# Patient Record
Sex: Female | Born: 1995 | Race: White | Hispanic: No | Marital: Single | Smoking: Never smoker
Health system: Southern US, Community
[De-identification: ages and names within clinical notes are randomized; demographics above are authoritative.]

## PROBLEM LIST (undated history)

## (undated) DIAGNOSIS — R569 Unspecified convulsions: Secondary | ICD-10-CM

## (undated) DIAGNOSIS — G43909 Migraine, unspecified, not intractable, without status migrainosus: Secondary | ICD-10-CM

## (undated) DIAGNOSIS — F913 Oppositional defiant disorder: Secondary | ICD-10-CM

## (undated) HISTORY — PX: NO PAST SURGERIES: SHX2092

---
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
## 2016-03-19 ENCOUNTER — Emergency Department: Payer: BLUE CROSS/BLUE SHIELD

## 2016-03-19 ENCOUNTER — Emergency Department
Admission: EM | Admit: 2016-03-19 | Discharge: 2016-03-20 | Disposition: A | Discharge status: Home / Self Care | Payer: BLUE CROSS/BLUE SHIELD | Attending: Emergency Medicine | Admitting: Emergency Medicine

## 2016-03-19 ENCOUNTER — Encounter: Payer: Self-pay | Admitting: Emergency Medicine

## 2016-03-19 DIAGNOSIS — Y999 Unspecified external cause status: Secondary | ICD-10-CM | POA: Insufficient documentation

## 2016-03-19 DIAGNOSIS — S99911A Unspecified injury of right ankle, initial encounter: Secondary | ICD-10-CM | POA: Diagnosis present

## 2016-03-19 DIAGNOSIS — W108XXA Fall (on) (from) other stairs and steps, initial encounter: Secondary | ICD-10-CM | POA: Diagnosis not present

## 2016-03-19 DIAGNOSIS — Y929 Unspecified place or not applicable: Secondary | ICD-10-CM | POA: Diagnosis not present

## 2016-03-19 DIAGNOSIS — Y9301 Activity, walking, marching and hiking: Secondary | ICD-10-CM | POA: Insufficient documentation

## 2016-03-19 DIAGNOSIS — Z791 Long term (current) use of non-steroidal anti-inflammatories (NSAID): Secondary | ICD-10-CM | POA: Insufficient documentation

## 2016-03-19 DIAGNOSIS — S82891A Other fracture of right lower leg, initial encounter for closed fracture: Secondary | ICD-10-CM | POA: Insufficient documentation

## 2016-03-19 DIAGNOSIS — Z79899 Other long term (current) drug therapy: Secondary | ICD-10-CM | POA: Insufficient documentation

## 2016-03-19 LAB — COMPREHENSIVE METABOLIC PANEL
ALK PHOS: 119 U/L (ref 38–126)
ALT: 12 U/L — AB (ref 14–54)
AST: 17 U/L (ref 15–41)
Albumin: 4.8 g/dL (ref 3.5–5.0)
Anion gap: 7 (ref 5–15)
BILIRUBIN TOTAL: 0.5 mg/dL (ref 0.3–1.2)
BUN: 9 mg/dL (ref 6–20)
CALCIUM: 9.8 mg/dL (ref 8.9–10.3)
CO2: 30 mmol/L (ref 22–32)
CREATININE: 0.83 mg/dL (ref 0.44–1.00)
Chloride: 104 mmol/L (ref 101–111)
Glucose, Bld: 124 mg/dL — ABNORMAL HIGH (ref 65–99)
Potassium: 3.7 mmol/L (ref 3.5–5.1)
Sodium: 141 mmol/L (ref 135–145)
TOTAL PROTEIN: 8.4 g/dL — AB (ref 6.5–8.1)

## 2016-03-19 LAB — URINALYSIS COMPLETE WITH MICROSCOPIC (ARMC ONLY)
BILIRUBIN URINE: NEGATIVE
Bacteria, UA: NONE SEEN
GLUCOSE, UA: NEGATIVE mg/dL
HGB URINE DIPSTICK: NEGATIVE
Ketones, ur: NEGATIVE mg/dL
LEUKOCYTES UA: NEGATIVE
NITRITE: NEGATIVE
Protein, ur: NEGATIVE mg/dL
SPECIFIC GRAVITY, URINE: 1.02 (ref 1.005–1.030)
pH: 7 (ref 5.0–8.0)

## 2016-03-19 LAB — URINE DRUG SCREEN, QUALITATIVE (ARMC ONLY)
AMPHETAMINES, UR SCREEN: NOT DETECTED
Barbiturates, Ur Screen: NOT DETECTED
Benzodiazepine, Ur Scrn: NOT DETECTED
CANNABINOID 50 NG, UR ~~LOC~~: NOT DETECTED
Cocaine Metabolite,Ur ~~LOC~~: NOT DETECTED
MDMA (ECSTASY) UR SCREEN: NOT DETECTED
METHADONE SCREEN, URINE: NOT DETECTED
Opiate, Ur Screen: NOT DETECTED
Phencyclidine (PCP) Ur S: NOT DETECTED
TRICYCLIC, UR SCREEN: NOT DETECTED

## 2016-03-19 LAB — CBC
HCT: 42 % (ref 35.0–47.0)
HEMOGLOBIN: 14.1 g/dL (ref 12.0–16.0)
MCH: 29.5 pg (ref 26.0–34.0)
MCHC: 33.5 g/dL (ref 32.0–36.0)
MCV: 88 fL (ref 80.0–100.0)
PLATELETS: 355 10*3/uL (ref 150–440)
RBC: 4.78 MIL/uL (ref 3.80–5.20)
RDW: 13.5 % (ref 11.5–14.5)
WBC: 12.1 10*3/uL — ABNORMAL HIGH (ref 3.6–11.0)

## 2016-03-19 LAB — POCT PREGNANCY, URINE: Preg Test, Ur: NEGATIVE

## 2016-03-19 LAB — PREGNANCY, URINE: PREG TEST UR: NEGATIVE

## 2016-03-19 NOTE — ED Provider Notes (Signed)
[facility] Emergency Department Provider Note   ____________________________________________  Time seen: Approximately 11:38 PM  I have reviewed the triage vital signs and the nursing notes.   HISTORY  Chief Complaint Dizziness    HPI Christina Case is a 20 y.o. female patient reports she was walking and got lightheaded and fell down. She fell down steps and hurt her right ankle. Her right ankle hurting at the tip of the fibula. Patient is never done this before. Patient said she used to have seizures when she was very small and took medicines but has not taken any medicines for at least 2 years. She did not have any seizures in that time. This did not really feel like a seizure. She did not lose consciousness. She did not hit her head and had no headache or neck pain. She later developed a headache that gradually increased to be fairly bad. She reports she is not lightheaded when she stands here.  Past Medical History  Diagnosis Date  . ODD (oppositional defiant disorder)     Patient Active Problem List   Diagnosis Date Noted  . 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 ODD (oppositional defiant disorder) 03/02/2013  . Simple phobia 03/02/2013    History reviewed. No pertinent past surgical history.  Current Outpatient Rx  Name  Route  Sig  Dispense  Refill  . buPROPion (WELLBUTRIN XL) 300 MG 24 hr tablet   Oral   Take 1 tablet (300 mg total) by mouth daily.   30 tablet   0   . diclofenac (VOLTAREN) 75 MG EC tablet   Oral   Take 75 mg by mouth 2 (two) times daily.         . diclofenac (VOLTAREN) 75 MG EC tablet   Oral   Take 1 tablet (75 mg total) by mouth 2 (two) times daily. Patient may resume home supply.         . loratadine (CLARITIN) 10 MG tablet   Oral   Take 10 mg by mouth daily.         Marland Kitchen. loratadine (CLARITIN) 10 MG tablet   Oral   Take 1 tablet (10 mg total) by mouth daily. Patient may resume home supply.            Allergies Hydrocodone and Oxycodone  History reviewed. No pertinent family history.  Social History Social History  Substance Use Topics  . Smoking status: None  . Smokeless tobacco: None  . Alcohol Use: None    Review of Systems Constitutional: No fever/chills Eyes: No visual changes. ENT: No sore throat. Cardiovascular: Denies chest pain. Respiratory: Denies shortness of breath. Gastrointestinal: No abdominal pain.  No nausea, no vomiting.  No diarrhea.  No constipation. Genitourinary: Negative for dysuria. Musculoskeletal: Negative for back pain. Skin: Negative for rash. Neurological: Negative for focal weakness or numbness.  10-point ROS otherwise negative.  ____________________________________________   PHYSICAL EXAM:  VITAL SIGNS: ED Triage Vitals  Enc Vitals Group     BP 03/19/16 2122 131/82 mmHg     Pulse Rate 03/19/16 2122 98     Resp 03/19/16 2122 18     Temp 03/19/16 2122 98.4 F (36.9 C)     Temp Source 03/19/16 2122 Oral     SpO2 03/19/16 2122 100 %     Weight 03/19/16 2122 130 lb (58.968 kg)     Height 03/19/16 2122 5\' 6"  (1.676 m)     Head Cir --  Peak Flow --      Pain Score 03/19/16 2122 4     Pain Loc --      Pain Edu? --      Excl. in GC? --     Constitutional: Alert and oriented. Well appearing and in no acute distress. Eyes: Conjunctivae are normal. PERRL. EOMI. Head: Atraumatic. Nose: No congestion/rhinnorhea. Mouth/Throat: Mucous membranes are moist.  Oropharynx non-erythematous. Neck: No stridor.  No cervical spine tenderness to palpation. Cardiovascular: Normal rate, regular rhythm. Grossly normal heart sounds.  Good peripheral circulation. Respiratory: Normal respiratory effort.  No retractions. Lungs CTAB. Gastrointestinal: Soft and nontender. No distention. No abdominal bruits. No CVA tenderness. Musculoskeletal: No lower extremity tenderness nor edema Except for some tenderness at the distal tip of the right fibula..   No joint effusions. Neurologic:  Normal speech and language. No gross focal neurologic deficits are appreciated. No gait instability. Skin:  Skin is warm, dry and intact. No rash noted. Psychiatric: Mood and affect are normal. Speech and behavior are normal.  ____________________________________________   LABS (all labs ordered are listed, but only abnormal results are displayed)  Labs Reviewed  CBC - Abnormal; Notable for the following:    WBC 12.1 (*)    All other components within normal limits  COMPREHENSIVE METABOLIC PANEL - Abnormal; Notable for the following:    Glucose, Bld 124 (*)    Total Protein 8.4 (*)    ALT 12 (*)    All other components within normal limits  URINALYSIS COMPLETEWITH MICROSCOPIC ([facility] ONLY) - Abnormal; Notable for the following:    Color, Urine YELLOW (*)    APPearance CLEAR (*)    Squamous Epithelial / LPF 0-5 (*)    All other components within normal limits  PREGNANCY, URINE  URINE DRUG SCREEN, QUALITATIVE ([facility] ONLY)  POC URINE PREG, ED  POCT PREGNANCY, URINE   ____________________________________________  EKG  EKG read and interpreted by me shows normal sinus rhythm at a rate of 85 normal axis ____________________________________________  RADIOLOGY  ____________________________________________  IMPRESSION: Soft tissue swelling about the lateral malleolus with associated tiny age-indeterminate ossicle adjacent to the tip of the fibula favored to represent an age-indeterminate avulsive injury. Correlation for tenderness at this location is recommended. Otherwise, no displaced fracture.   Electronically Signed  By: Simonne Come M.D.  On: 03/19/2016 21:51   Chest x-ray read as no acute disease PROCEDURES    ____________________________________________   INITIAL IMPRESSION / ASSESSMENT AND PLAN / ED COURSE  Pertinent labs & imaging results that were available during my care of the patient were reviewed by me and  considered in my medical decision making (see chart for details).   ____________________________________________   FINAL CLINICAL IMPRESSION(S) / ED DIAGNOSES  Final diagnoses:  Avulsion fracture of ankle, right, closed, initial encounter   Additional diagnosis is near syncope   NEW MEDICATIONS STARTED DURING THIS VISIT:  New Prescriptions   No medications on file     Note:  This document was prepared using Dragon voice recognition software and may include unintentional dictation errors.    Arnaldo Natal, MD 03/19/16 [phone]

## 2016-03-19 NOTE — Discharge Instructions (Signed)
Cast or Splint Care Casts and splints support injured limbs and keep bones from moving while they heal. It is important to care for your cast or splint at home.  HOME CARE INSTRUCTIONS  Keep the cast or splint uncovered during the drying period. It can take 24 to 48 hours to dry if it is made of plaster. A fiberglass cast will dry in less than 1 hour.  Do not rest the cast on anything harder than a pillow for the first 24 hours.  Do not put weight on your injured limb or apply pressure to the cast until your health care provider gives you permission.  Keep the cast or splint dry. Wet casts or splints can lose their shape and may not support the limb as well. A wet cast that has lost its shape can also create harmful pressure on your skin when it dries. Also, wet skin can become infected.  Cover the cast or splint with a plastic bag when bathing or when out in the rain or snow. If the cast is on the trunk of the body, take sponge baths until the cast is removed.  If your cast does become wet, dry it with a towel or a blow dryer on the cool setting only.  Keep your cast or splint clean. Soiled casts may be wiped with a moistened cloth.  Do not place any hard or soft foreign objects under your cast or splint, such as cotton, toilet paper, lotion, or powder.  Do not try to scratch the skin under the cast with any object. The object could get stuck inside the cast. Also, scratching could lead to an infection. If itching is a problem, use a blow dryer on a cool setting to relieve discomfort.  Do not trim or cut your cast or remove padding from inside of it.  Exercise all joints next to the injury that are not immobilized by the cast or splint. For example, if you have a long leg cast, exercise the hip joint and toes. If you have an arm cast or splint, exercise the shoulder, elbow, thumb, and fingers.  Elevate your injured arm or leg on 1 or 2 pillows for the first 1 to 3 days to decrease  swelling and pain.It is best if you can comfortably elevate your cast so it is higher than your heart. SEEK MEDICAL CARE IF:   Your cast or splint cracks.  Your cast or splint is too tight or too loose.  You have unbearable itching inside the cast.  Your cast becomes wet or develops a soft spot or area.  You have a bad smell coming from inside your cast.  You get an object stuck under your cast.  Your skin around the cast becomes red or raw.  You have new pain or worsening pain after the cast has been applied. SEEK IMMEDIATE MEDICAL CARE IF:   You have fluid leaking through the cast.  You are unable to move your fingers or toes.  You have discolored (blue or white), cool, painful, or very swollen fingers or toes beyond the cast.  You have tingling or numbness around the injured area.  You have severe pain or pressure under the cast.  You have any difficulty with your breathing or have shortness of breath.  You have chest pain.   This information is not intended to replace advice given to you by your health care provider. Make sure you discuss any questions you have with your health care  provider.   Document Released: 11/01/2000 Document Revised: 08/25/2013 Document Reviewed: 05/13/2013 Elsevier Interactive Patient Education 2016 [employer]  Rhythm air splint for now he can take it off to shower but put it on afterwards. Tylenol or Motrin for pain. Call Dr. Samuel GermanyKrasinski's office he see orthopedic surgeon on call he can follow-up for your ankle avulsion fracture. [facility] with [facility] for the lightheadedness you had. Please return for any further problems

## 2016-03-19 NOTE — ED Notes (Signed)
Pt in with co dizziness for few days denies any recent illness and fell down 2 steps co right ankle pain due to fall.  Pt is ambulatory to triage.

## 2016-03-19 NOTE — ED Notes (Signed)
MD at bedside. 

## 2016-07-13 ENCOUNTER — Encounter: Payer: Self-pay | Admitting: Emergency Medicine

## 2016-07-13 ENCOUNTER — Emergency Department
Admission: EM | Admit: 2016-07-13 | Discharge: 2016-07-14 | Disposition: A | Discharge status: Other Acute Inpatient Hospital | Payer: BLUE CROSS/BLUE SHIELD | Attending: Emergency Medicine | Admitting: Emergency Medicine

## 2016-07-13 DIAGNOSIS — R45851 Suicidal ideations: Secondary | ICD-10-CM | POA: Insufficient documentation

## 2016-07-13 DIAGNOSIS — F913 Oppositional defiant disorder: Secondary | ICD-10-CM | POA: Insufficient documentation

## 2016-07-13 LAB — COMPREHENSIVE METABOLIC PANEL
ALT: 12 U/L — AB (ref 14–54)
AST: 20 U/L (ref 15–41)
Albumin: 4.8 g/dL (ref 3.5–5.0)
Alkaline Phosphatase: 122 U/L (ref 38–126)
Anion gap: 8 (ref 5–15)
BILIRUBIN TOTAL: 0.8 mg/dL (ref 0.3–1.2)
BUN: 8 mg/dL (ref 6–20)
CHLORIDE: 106 mmol/L (ref 101–111)
CO2: 27 mmol/L (ref 22–32)
CREATININE: 0.63 mg/dL (ref 0.44–1.00)
Calcium: 9.9 mg/dL (ref 8.9–10.3)
GFR calc Af Amer: >60 mL/min (ref >60)
Glucose, Bld: 103 mg/dL — ABNORMAL HIGH (ref 65–99)
Potassium: 4.3 mmol/L (ref 3.5–5.1)
Sodium: 141 mmol/L (ref 135–145)
Total Protein: 8.1 g/dL (ref 6.5–8.1)

## 2016-07-13 LAB — ETHANOL: Alcohol, Ethyl (B): <5 mg/dL (ref <5)

## 2016-07-13 LAB — CBC
HCT: 40.9 % (ref 35.0–47.0)
Hemoglobin: 13.7 g/dL (ref 12.0–16.0)
MCH: 29.9 pg (ref 26.0–34.0)
MCHC: 33.4 g/dL (ref 32.0–36.0)
MCV: 89.4 fL (ref 80.0–100.0)
PLATELETS: 335 10*3/uL (ref 150–440)
RBC: 4.57 MIL/uL (ref 3.80–5.20)
RDW: 12.8 % (ref 11.5–14.5)
WBC: 8.1 10*3/uL (ref 3.6–11.0)

## 2016-07-13 LAB — ACETAMINOPHEN LEVEL: Acetaminophen (Tylenol), Serum: <10 ug/mL — ABNORMAL LOW (ref 10–30)

## 2016-07-13 LAB — SALICYLATE LEVEL

## 2016-07-13 NOTE — ED Notes (Signed)
Pt given meal tray at this time. NAD noted. Pt remains with 1:1 sitter. Denies any needs at this time, will continue to monitor for further patient needs.

## 2016-07-13 NOTE — BH Assessment (Signed)
Assessment Note  Christina Case is an 20 y.o. female. Who has presented to the ER voluntarily seeking a psychiatric consult. Pt states that she was recently kicked out of her parents home and that she is struggling with adjusting to this sudden change. Pt reports that her parents think that she is disrespectful although she denies this. Pt states that she was in college but failed last semester and consequently has been forced to sit out until the spring.  Pt reports living with various friends and working part time at [employer]. Pt states that she enjoys her job and continues to enjoy spending time with her friends and participating in other leisure activities. Pt reports no change in sleep habits or appetite. Pt states that she has felt depressed for approximately one week and that she has began to have fleeting suicidal thoughts, onset x2 days. Pt states " I don' t want to deal with the situation so sometimes hurting myself seems easier." Pt did contract for safety with [facility] research [employer], stating that she felt she could be safe if discharged, although she has no where to go. Pt denies HI or substance use. Pt. denies the presence of any auditory or visual hallucinations at this time. Patient denies any other medical complaints. Pt states that she was most recently admitted to an inpatient unit in [facility] due to a suicide attempt. Pt shared that she attempted to overdose on caffeine pills. Pt is currently participating in individual therapy at [facility] located in [place]. Pt  admits to non- compliance with medication requests.Per pt medical record she carries a Dx of ODD.  Diagnosis: Adjustment Disorder     Past Medical History:  Past Medical History:  Diagnosis Date  . ODD (oppositional defiant disorder)     No past surgical history on file.  Family History: No family history on file.  Social History:  reports that she has never smoked. She has never used smokeless tobacco. She  reports that she drinks alcohol. She reports that she does not use drugs.  Additional Social History:  Alcohol / Drug Use Pain Medications: see MAR Prescriptions: see MAR Over the Counter: see MAR History of alcohol / drug use?: No history of alcohol / drug abuse Longest period of sobriety (when/how long): na  CIWA: CIWA-Ar BP: 125/70 Pulse Rate: 83 COWS:    Allergies:  Allergies  Allergen Reactions  . Hydrocodone Other (See Comments)    dizziness  . Oxycodone Other (See Comments)    dizziness  . Zofran [Ondansetron Hcl] Nausea And Vomiting    Home Medications:  (Not in a hospital admission)  OB/GYN Status:  Patient's last menstrual period was 07/04/2016.  General Assessment Data Location of Assessment: [facility] ED TTS Assessment: In system Is this a Tele or Face-to-Face Assessment?: Face-to-Face Is this an Initial Assessment or a Re-assessment for this encounter?: Initial Assessment Marital status: Single Is patient pregnant?: No Pregnancy Status: No Living Arrangements: Other (Comment), Non-relatives/Friends Can pt return to current living arrangement?: Yes Admission Status: Voluntary Is patient capable of signing voluntary admission?: Yes Referral Source: Self/Family/Friend Insurance type: BCBS  Medical Screening Exam [facility] Walk-in ONLY) Medical Exam completed: Yes  Crisis Care Plan Living Arrangements: Other (Comment), Non-relatives/Friends Legal Guardian: Other: Name of Psychiatrist: None Name of Therapist: None   Education Status Is patient currently in school?: No Current Grade: Some College Highest grade of school patient has completed: Some College Name of school: N/A Contact person: N/A  Risk to self with  the past 6 months Suicidal Ideation: Yes-Currently Present Has patient been a risk to self within the past 6 months prior to admission? : Yes Suicidal Intent: No Has patient had any suicidal intent within the past 6 months prior to admission? :  No Is patient at risk for suicide?: Yes Suicidal Plan?: No Has patient had any suicidal plan within the past 6 months prior to admission? : No Access to Means:  (N/A) What has been your use of drugs/alcohol within the last 12 months?: Alcohol- Distant past  Previous Attempts/Gestures: Yes How many times?: 1 Other Self Harm Risks: 0 Triggers for Past Attempts: Unknown Intentional Self Injurious Behavior: None Family Suicide History: No Recent stressful life event(s): Conflict (Comment), Loss (Comment), Financial Problems Persecutory voices/beliefs?: No Depression: Yes Depression Symptoms: Tearfulness, Feeling worthless/self pity Substance abuse history and/or treatment for substance abuse?: No Suicide prevention information given to non-admitted patients: Yes  Risk to Others within the past 6 months Homicidal Ideation: No Does patient have any lifetime risk of violence toward others beyond the six months prior to admission? : No Thoughts of Harm to Others: No Current Homicidal Intent: No Current Homicidal Plan: No Access to Homicidal Means: No Identified Victim: N/A History of harm to others?: No Assessment of Violence: None Noted Violent Behavior Description: None Noted Does patient have access to weapons?: No Criminal Charges Pending?: No Does patient have a court date: No Is patient on probation?: No  Psychosis Hallucinations: None noted Delusions: None noted  Mental Status Report Appearance/Hygiene: In scrubs Eye Contact: Good Motor Activity: Freedom of movement Speech: Soft Level of Consciousness: Alert Mood: Depressed Affect: Sullen, Sad Anxiety Level: None Thought Processes: Relevant Judgement: Partial Orientation: Place, Person, Time, Situation Obsessive Compulsive Thoughts/Behaviors: None  Cognitive Functioning Concentration: Good Memory: Remote Intact, Recent Intact IQ: Average Insight: Fair Impulse Control: Fair Appetite: Fair Weight Loss:  0 Weight Gain: 0 Sleep: No Change Total Hours of Sleep: 6 Vegetative Symptoms: None  ADLScreening [facility] Assessment Services) Patient's cognitive ability adequate to safely complete daily activities?: Yes Patient able to express need for assistance with ADLs?: Yes Independently performs ADLs?: Yes (appropriate for developmental age)  Prior Inpatient Therapy Prior Inpatient Therapy: Yes Prior Therapy Dates: 04/2016 Prior Therapy Facilty/Provider(s): Unknown  [facility]([place]) Reason for Treatment: OD  Prior Outpatient Therapy Prior Outpatient Therapy: Yes Prior Therapy Dates: Current  Prior Therapy Facilty/Provider(s): [facility] [place]  Reason for Treatment: Depression  Does patient have an ACCT team?: No Does patient have Intensive In-House Services?  : No Does patient have [facility] services? : No Does patient have P4CC services?: No  ADL Screening (condition at time of admission) Patient's cognitive ability adequate to safely complete daily activities?: Yes Patient able to express need for assistance with ADLs?: Yes Independently performs ADLs?: Yes (appropriate for developmental age)       Abuse/Neglect Assessment (Assessment to be complete while patient is alone) Physical Abuse: Denies Verbal Abuse: Denies Sexual Abuse: Denies Exploitation of patient/patient's resources: Denies Values / Beliefs Cultural Requests During Hospitalization: None Spiritual Requests During Hospitalization: None Consults Spiritual Care Consult Needed: No Social Work Consult Needed: No Merchant navy officerAdvance Directives (For Healthcare) Does patient have an advance directive?: No Would patient like information on creating an advanced directive?: No - patient declined information    Additional Information 1:1 In Past 12 Months?: No CIRT Risk: No Elopement Risk: No Does patient have medical clearance?: Yes     Disposition:  Disposition Initial Assessment Completed for this Encounter:  Yes Disposition  of Patient: Referred to Patient referred to: Other (Comment) (Consult with Psych MD)  On Site Evaluation by:   Reviewed with Physician:    Asa Saunas 07/13/2016 5:39 PM

## 2016-07-13 NOTE — ED Provider Notes (Signed)
[facility] Emergency Department Provider Note   ____________________________________________   None    (approximate)  I have reviewed the triage vital signs and the nursing notes.  ----------------------------------------- 4:32 PM on 07/13/2016 -----------------------------------------  A computer is not marking me as having seen the patient and I'm not sure why HISTORY  Chief Complaint Suicidal   HPI Christina Case is a 20 y.o. female patient reports she failed out of Last year and had told the tach that she wanted to kill herself because her parents will not let her live in their house. She will not tell me anything except for that she failed at [facility] last year and she might go back in Spring. He is any medical problems or any other things going on with her.   Past Medical History:  Diagnosis Date  . ODD (oppositional defiant disorder)     Patient Active Problem List   Diagnosis Date Noted  . 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 ODD (oppositional defiant disorder) 03/02/2013  . 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  Prior to Admission medications   Medication Sig Start Date End Date Taking? Authorizing Provider  buPROPion (WELLBUTRIN XL) 300 MG 24 hr tablet Take 1 tablet (300 mg total) by mouth daily. 03/09/13   Jolene Schimke, NP  diclofenac (VOLTAREN) 75 MG EC tablet Take 75 mg by mouth 2 (two) times daily.    Historical Provider, MD  diclofenac (VOLTAREN) 75 MG EC tablet Take 1 tablet (75 mg total) by mouth 2 (two) times daily. Patient may resume home supply. 03/09/13   Jolene Schimke, NP  loratadine (CLARITIN) 10 MG tablet Take 10 mg by mouth daily.    Historical Provider, MD  loratadine (CLARITIN) 10 MG tablet Take 1 tablet (10 mg total) by mouth daily. Patient may resume home supply. 03/09/13   Jolene Schimke, NP    Allergies Hydrocodone; Oxycodone; and Zofran [ondansetron hcl]  No family history on file.  Social  History Social History  Substance Use Topics  . Smoking status: Never Smoker  . Smokeless tobacco: Never Used  . Alcohol use Yes     Comment: ocassionally    Review of Systems Patient will not tell me anything about any review of systems  ____________________________________________   PHYSICAL EXAM:  VITAL SIGNS: ED Triage Vitals [07/13/16 1550]  Enc Vitals Group     BP 125/70     Pulse Rate 83     Resp 18     Temp 98.4 F (36.9 C)     Temp Source Oral     SpO2 98 %     Weight 140 lb (63.5 kg)     Height 5\' 6"  (1.676 m)     Head Circumference      Peak Flow      Pain Score      Pain Loc      Pain Edu?      Excl. in GC?     Constitutional: Alert and oriented. Well appearing and in no acute distress. Eyes: Conjunctivae are normal. PERRL. EOMI. Head: Atraumatic. Nose: No congestion/rhinnorhea. Mouth/Throat: Mucous membranes are moist.  Oropharynx non-erythematous. Neck: No stridor. Cardiovascular: Normal rate, regular rhythm. Grossly normal heart sounds.  Good peripheral circulation. Respiratory: Normal respiratory effort.  No retractions. Lungs CTAB. Gastrointestinal: Soft and nontender. No distention. No abdominal bruits. No CVA tenderness. Musculoskeletal: No lower extremity tenderness nor edema.  No joint effusions. Neurologic:  Normal speech and  language. No gross focal neurologic deficits are appreciated. No gait instability. Skin:  Skin is warm, dry and intact. No rash noted. .  ____________________________________________   LABS (all labs ordered are listed, but only abnormal results are displayed)  Labs Reviewed  COMPREHENSIVE METABOLIC PANEL - Abnormal; Notable for the following:       Result Value   Glucose, Bld 103 (*)    ALT 12 (*)    All other components within normal limits  ACETAMINOPHEN LEVEL - Abnormal; Notable for the following:    Acetaminophen (Tylenol), Serum <10 (*)    All other components within normal limits  CBC  ETHANOL   SALICYLATE LEVEL  URINE DRUG SCREEN, QUALITATIVE ([facility] ONLY)  POC URINE PREG, ED   ____________________________________________  EKG   ____________________________________________  RADIOLOGY   ____________________________________________   PROCEDURES  Procedure(s) performed:   Procedures  Critical Care performed:  ____________________________________________   INITIAL IMPRESSION / ASSESSMENT AND PLAN / ED COURSE  Pertinent labs & imaging results that were available during my care of the patient were reviewed by me and considered in my medical decision making (see chart for details).    Clinical Course     ____________________________________________   FINAL CLINICAL IMPRESSION(S) / ED DIAGNOSES  Final diagnoses:  Suicidal ideation      NEW MEDICATIONS STARTED DURING THIS VISIT:  New Prescriptions   No medications on file     Note:  This document was prepared using Dragon voice recognition software and may include unintentional dictation errors.    Arnaldo NatalPaul F Mohit Zirbes, MD 07/13/16 Ernestina [id number]

## 2016-07-13 NOTE — ED Notes (Signed)
Pt belongings obtained by this tech and Fernand ParkinsSusan N (RN) pt refuses to give RN any information however verbalizes to this tech that she wants to kill herself due to parents not allowing her to live in their home. Pt states "my parents think that I am disrespectful and I don't have anywhere to live except I've been staying here and there with friends"

## 2016-07-13 NOTE — ED Notes (Signed)
Report was received from Rudy JewWendy L., RN; Pt. Verbalizes no complaints or distress; verbalizes having had  S.I x (2) days; has had disagreement with her parents; parents "kicked" her out; currently staying with friends; .; denies having Hi. Continue to monitor with 15 min. Monitoring.

## 2016-07-13 NOTE — ED Triage Notes (Signed)
States suicidal x 2 days - refuses to give any other information. Last admitted in June. See outpatient

## 2016-07-13 NOTE — ED Notes (Signed)
Patient is oriented, she has was feeling Si and told her friend and her friend wanted her to come in, and she said she listened to her friend, but she now feels feels better and hopes to be able to go home, Patient states that He mom and dad just kicked her out of her home and she has been depressed about that, she states she cannot talk to them about anything. Patient has a job and lives with a friend. Patient denies Si/hi or avh at this time. q 15 min. Checks and camera monitoring in progress.

## 2016-07-13 NOTE — ED Notes (Signed)
Pt remains with 1:1 sitter at this time. NAD noted. Pt calm and cooperative with staff at this time. Will continue to monitor for further patient needs.

## 2016-07-13 NOTE — ED Notes (Signed)
ED BHU PLACEMENT JUSTIFICATION Is the patient under IVC or is there intent for IVC: Yes.   Is the patient medically cleared: Yes.   Is there vacancy in the ED BHU: Yes.   Is the population mix appropriate for patient: Yes.   Is the patient awaiting placement in inpatient or outpatient setting: No. Has the patient had a psychiatric consult: Yes.   Survey of unit performed for contraband, proper placement and condition of furniture, tampering with fixtures in bathroom, shower, and each patient room: Yes.   APPEARANCE/BEHAVIOR calm, cooperative and adequate rapport can be established NEURO ASSESSMENT Orientation: time, place and person Hallucinations: No.None noted (Hallucinations) Speech: Normal Gait: normal RESPIRATORY ASSESSMENT Normal expansion.  Clear to auscultation.  No rales, rhonchi, or wheezing. CARDIOVASCULAR ASSESSMENT regular rate and rhythm, S1, S2 normal, no murmur, click, rub or gallop GASTROINTESTINAL ASSESSMENT soft, nontender, BS WNL, no r/g EXTREMITIES normal strength, tone, and muscle mass PLAN OF CARE Provide calm/safe environment. Vital signs assessed twice daily. ED BHU Assessment once each 12-hour shift. Collaborate with intake RN daily or as condition indicates. Assure the ED provider has rounded once each shift. Provide and encourage hygiene. Provide redirection as needed. Assess for escalating behavior; address immediately and inform ED provider.  Assess family dynamic and appropriateness for visitation as needed: Yes.   Educate the patient/family about BHU procedures/visitation: Yes.   

## 2016-07-13 NOTE — ED Notes (Signed)
Report called to Amy H, RN.  

## 2016-07-14 ENCOUNTER — Inpatient Hospital Stay
Admit: 2016-07-14 | Discharge: 2016-07-23 | DRG: 885 | Disposition: A | Discharge status: Home / Self Care | Payer: BLUE CROSS/BLUE SHIELD | Source: Intra-hospital | Attending: Psychiatry | Admitting: Psychiatry

## 2016-07-14 DIAGNOSIS — Z62810 Personal history of physical and sexual abuse in childhood: Secondary | ICD-10-CM | POA: Diagnosis present

## 2016-07-14 DIAGNOSIS — F4310 Post-traumatic stress disorder, unspecified: Secondary | ICD-10-CM | POA: Diagnosis present

## 2016-07-14 DIAGNOSIS — G4700 Insomnia, unspecified: Secondary | ICD-10-CM | POA: Diagnosis present

## 2016-07-14 DIAGNOSIS — F1010 Alcohol abuse, uncomplicated: Secondary | ICD-10-CM | POA: Diagnosis present

## 2016-07-14 DIAGNOSIS — Z885 Allergy status to narcotic agent status: Secondary | ICD-10-CM | POA: Diagnosis not present

## 2016-07-14 DIAGNOSIS — Z79899 Other long term (current) drug therapy: Secondary | ICD-10-CM

## 2016-07-14 DIAGNOSIS — Z915 Personal history of self-harm: Secondary | ICD-10-CM

## 2016-07-14 DIAGNOSIS — F332 Major depressive disorder, recurrent severe without psychotic features: Secondary | ICD-10-CM | POA: Diagnosis present

## 2016-07-14 DIAGNOSIS — R45851 Suicidal ideations: Secondary | ICD-10-CM | POA: Diagnosis present

## 2016-07-14 DIAGNOSIS — F429 Obsessive-compulsive disorder, unspecified: Secondary | ICD-10-CM | POA: Diagnosis present

## 2016-07-14 MED ORDER — ACETAMINOPHEN 325 MG PO TABS: 650.0000 mg | ORAL_TABLET | Freq: Four times a day (QID) | ORAL | Status: DC | PRN

## 2016-07-14 MED ORDER — MAGNESIUM HYDROXIDE 400 MG/5ML PO SUSP: 30.0000 mL | Freq: Every day | ORAL | Status: DC | PRN

## 2016-07-14 MED ORDER — TRAZODONE HCL 50 MG PO TABS: 50.0000 mg | ORAL_TABLET | Freq: Every evening | ORAL | Status: DC | PRN

## 2016-07-14 MED ORDER — LORATADINE 10 MG PO TABS
10.0000 mg | ORAL_TABLET | Freq: Every day | ORAL | Status: DC
  Administered 2016-07-14 – 2016-07-22 (×8): 10 mg via ORAL
  Filled 2016-07-14 (×11): qty 1

## 2016-07-14 MED ORDER — BUPROPION HCL ER (XL) 150 MG PO TB24
300.0000 mg | ORAL_TABLET | Freq: Every day | ORAL | Status: DC
  Administered 2016-07-14 – 2016-07-15 (×2): 300 mg via ORAL
  Filled 2016-07-14 (×2): qty 2

## 2016-07-14 MED ORDER — ALUM & MAG HYDROXIDE-SIMETH 200-200-20 MG/5ML PO SUSP
30.0000 mL | ORAL | Status: DC | PRN
  Filled 2016-07-14: qty 30

## 2016-07-14 NOTE — Tx Team (Cosign Needed)
Initial Treatment Plan 07/14/2016 4:36 PM Marcia BrashCaitlin M Astorga RUE:[id number]    PATIENT STRESSORS: Educational concerns Marital or family conflict   PATIENT STRENGTHS: Average or above average intelligence Capable of independent living Physical Health   PATIENT IDENTIFIED PROBLEMS: Depression  8/27    Suicidal ideas 8/27                 DISCHARGE CRITERIA:  Adequate post-discharge living arrangements Improved stabilization in mood, thinking, and/or behavior Motivation to continue treatment in a less acute level of care  PRELIMINARY DISCHARGE PLAN: Outpatient therapy Placement in alternative living arrangements  PATIENT/FAMILY INVOLVEMENT: This treatment plan has been presented to and reviewed with the patient, Marcia BrashCaitlin M Lippold, and/or family member, .  The patient and family have been given the opportunity to ask questions and make suggestions.  Ignacia FellingJennifer A Latausha Flamm, RN 07/14/2016, 4:36 PM

## 2016-07-14 NOTE — ED Notes (Signed)
Patient was complaining that it was too hot in room 1, nurse moved to room 8, she states she feels better, denies Si/hi or avh, she is calm and cooperative, q 15 min. Checks and camera monitoring In progress.

## 2016-07-14 NOTE — Progress Notes (Signed)
Patient with sad affect, pleasant and cooperative with admission and evaluation. Skin check with no wounds or bruises. Skin check with no contraband. No SI/HI at this time. Patient reports her stressors as relationship with parents, lack of communication with parents and parents having her move out. Patient reports her living situation with friends as stressful and financial concerns as she works in [employer]ail but wants to go to [employer] ( taking one semester off). Orient to unit, dinner tray ordered. No SI/HI at this time. Safety maintained.

## 2016-07-14 NOTE — ED Notes (Signed)
Patient is talking on phone to friends at this time. [facility] and she is aware and is ok with it.

## 2016-07-14 NOTE — Progress Notes (Signed)
LCSW called and spoke to Kerr-McGeeJennifer [place] and gave her information on patient and requested a bed #.  LCSW was asked to call back in the afternoon as were are awaiting another pt to discharge.  Delta Air LinesClaudine Bralynn Velador LCSW [phone]

## 2016-07-14 NOTE — ED Provider Notes (Signed)
Specialist on-call calls back and recommends hospitalization I concur keep her in the BHU for the time being possibly we can treat hospitalized her here Monday.   Arnaldo NatalPaul F Nyjah Denio, MD 07/14/16 [phone]

## 2016-07-14 NOTE — ED Notes (Signed)
Patient's belongings taken with her downstairs, Patient transferred via w/c. No signs of distress.

## 2016-07-14 NOTE — ED Notes (Signed)
Patient's breakfast served.  

## 2016-07-14 NOTE — ED Notes (Signed)
Patient is alert and oriented, sitting in dayroom playing cards with the other Patient that was in room 5, patient is calm and cooperative, no signs of distress, Patient is safe, q 15 min. Checks and camera monitoring progress.

## 2016-07-14 NOTE — [facility] Group Notes (Signed)
[facility] Group Notes:  (Nursing/MHT/Case Management/Adjunct)  Date:  07/14/2016  Time:  11:57 PM  Type of Therapy:  Evening Wrap-up Group  Participation Level:  Active  Participation Quality:  Appropriate and Attentive  Affect:  Appropriate  Cognitive:  Alert and Appropriate  Insight:  Appropriate, Good and Improving  Engagement in Group:  Developing/Improving and Engaged  Modes of Intervention:  Activity  Summary of Progress/Problems:  Tomasita MorrowChelsea Nanta Kaithlyn Teagle 07/14/2016, 11:57 PM

## 2016-07-14 NOTE — ED Notes (Signed)
Patient's soc in progress.

## 2016-07-14 NOTE — ED Provider Notes (Signed)
-----------------------------------------   8:14 AM on 07/14/2016 -----------------------------------------   Blood pressure (!) 116/53, pulse 76, temperature 98.3 F (36.8 C), temperature source Oral, resp. rate 18, height 5\' 6"  (1.676 m), weight 140 lb (63.5 kg), last menstrual period 07/04/2016, SpO2 100 %.  The patient had no acute events since last update.  Calm and cooperative at this time.  Disposition is pending Psychiatry/Behavioral Medicine team recommendations.     Arnaldo NatalPaul F Malinda, MD 07/14/16 [id number]

## 2016-07-14 NOTE — ED Notes (Signed)
Lunch served

## 2016-07-14 NOTE — ED Notes (Signed)
Nurse talked with Patient and she states that she does feel depressed, but no Si at this time, she states the doctor said, well not anything has changed about her parents or her finances,  and He was worried that she could become Si again and wanted her to stay for a few days for treatment. Patient agreed that it would probably be for the best. q 15 min. Checks and camera monitoring in progress.

## 2016-07-14 NOTE — ED Notes (Signed)
Patient is alert and oriented, no signs of distress, still sitting in dayroom, Patient playing cards with the another Patient, Nurse did cal report to [facility]. Patient is going to room 320 for intreatment.

## 2016-07-15 ENCOUNTER — Encounter: Payer: Self-pay | Admitting: Psychiatry

## 2016-07-15 DIAGNOSIS — F4310 Post-traumatic stress disorder, unspecified: Secondary | ICD-10-CM | POA: Diagnosis present

## 2016-07-15 DIAGNOSIS — F332 Major depressive disorder, recurrent severe without psychotic features: Principal | ICD-10-CM

## 2016-07-15 DIAGNOSIS — R45851 Suicidal ideations: Secondary | ICD-10-CM

## 2016-07-15 DIAGNOSIS — F429 Obsessive-compulsive disorder, unspecified: Secondary | ICD-10-CM | POA: Diagnosis present

## 2016-07-15 LAB — URINE DRUG SCREEN, QUALITATIVE (ARMC ONLY)
AMPHETAMINES, UR SCREEN: NOT DETECTED
BARBITURATES, UR SCREEN: NOT DETECTED
BENZODIAZEPINE, UR SCRN: NOT DETECTED
COCAINE METABOLITE, UR ~~LOC~~: NOT DETECTED
Cannabinoid 50 Ng, Ur ~~LOC~~: NOT DETECTED
MDMA (Ecstasy)Ur Screen: NOT DETECTED
METHADONE SCREEN, URINE: NOT DETECTED
OPIATE, UR SCREEN: NOT DETECTED
PHENCYCLIDINE (PCP) UR S: NOT DETECTED
Tricyclic, Ur Screen: NOT DETECTED

## 2016-07-15 MED ORDER — FLUVOXAMINE MALEATE 50 MG PO TABS
50.0000 mg | ORAL_TABLET | Freq: Every day | ORAL | Status: DC
  Filled 2016-07-15: qty 1

## 2016-07-15 MED ORDER — PRAZOSIN HCL 2 MG PO CAPS
2.0000 mg | ORAL_CAPSULE | Freq: Two times a day (BID) | ORAL | Status: DC
  Administered 2016-07-15 – 2016-07-16 (×4): 2 mg via ORAL
  Filled 2016-07-15 (×5): qty 1

## 2016-07-15 MED ORDER — BUPROPION HCL ER (XL) 150 MG PO TB24
150.0000 mg | ORAL_TABLET | Freq: Every day | ORAL | Status: DC
  Administered 2016-07-16 – 2016-07-22 (×6): 150 mg via ORAL
  Filled 2016-07-15 (×7): qty 1

## 2016-07-15 NOTE — Progress Notes (Signed)
Recreation Therapy Notes  INPATIENT RECREATION THERAPY ASSESSMENT  Patient Details Name: Marcia BrashCaitlin M Stein MRN: [id number] DOB: 03/31/1996 Today's Date: 07/15/2016  Patient Stressors: Family, School, Other (Comment) Not a good relationship with family - never been a good relationship; failed out of school and was kicked out of her parents house - wants to go back to school; no home living with friends - parents kicked her out because she was "disrespectful", "a disappointment", and "an embarrassment"  Coping Skills:   Isolate, Exercise, Art/Dance, Music, Sports  Personal Challenges: Expressing Yourself, Relationships, Self-Esteem/Confidence, Stress Management, Time Management, Trusting Others  Leisure Interests (2+):  Nature - Museum/gallery exhibitions officerHiking, Individual - Other (Comment) (Dancing)  Awareness of Community Resources:  Yes  Community Resources:  [employer], Engineer, petroleumGym  Current Use: Yes  If no, Barriers?:    Patient Strengths:  No  Patient Identified Areas of Improvement:  "I don't know"  Current Recreation Participation:  Hanging out with friends, hiking, running  Patient Goal for Hospitalization:  None  [place]  [place]  County of Residence:  [place]   Current SI (including self-harm):  No  Current HI:  No  Consent to Intern Participation: N/A   Jacquelynn CreeGreene,Finnley Lewis M, LRT/CTRS 07/15/2016, 4:20 PM

## 2016-07-15 NOTE — [facility] Suicide Risk Assessment (Signed)
[facility]   Nursing information obtained from:    Demographic factors:    Current Mental Status:    Loss Factors:    Historical Factors:    Risk Reduction Factors:     Total Time spent with patient: 1 hour Principal Problem: <principal problem not specified> Diagnosis:   Patient Active Problem List   Diagnosis Date Noted  . Depression [F32.9] 07/14/2016  . 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 ODD (oppositional defiant disorder) [F91.3] 03/02/2013  . 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  Continued Clinical Symptoms:    The "Alcohol Use Disorders Identification Test", Guidelines for Use in Primary Care, Second Edition.  World Science writerHealth Organization [facility](WHO). Score between 0-7:  no or low risk or alcohol related problems. Score between 8-15:  moderate risk of alcohol related problems. Score between 16-19:  high risk of alcohol related problems. Score 20 or above:  warrants further diagnostic evaluation for alcohol dependence and treatment.   CLINICAL FACTORS:   Depression:   Impulsivity   Musculoskeletal: Strength & Muscle Tone: within normal limits Gait & Station: normal Patient leans: N/A  Psychiatric Specialty Exam: Physical Exam  Nursing note and vitals reviewed.   Review of Systems  Psychiatric/Behavioral: Positive for depression and suicidal ideas.  All other systems reviewed and are negative.   Blood pressure 118/73, pulse 88, temperature 98 F (36.7 C), temperature source Oral, resp. rate 18, height 5\' 5"  (1.651 m), weight 63.5 kg (140 lb), last menstrual period 07/04/2016, SpO2 99 %.Body mass index is 23.3 kg/m.  General Appearance: Casual  Eye Contact:  Good  Speech:  Clear and Coherent  Volume:  Normal  Mood:  Angry and Irritable  Affect:  Congruent  Thought Process:  Goal Directed  Orientation:  Full (Time, Place, and Person)  Thought Content:  WDL  Suicidal Thoughts:  Yes.  with  intent/plan  Homicidal Thoughts:  No  Memory:  Immediate;   Fair Recent;   Fair Remote;   Fair  Judgement:  Poor  Insight:  Shallow  Psychomotor Activity:  Normal  Concentration:  Concentration: Fair and Attention Span: Fair  Recall:  FiservFair  Fund of Knowledge:  Fair  Language:  Fair  Akathisia:  No  Handed:  Right  AIMS (if indicated):     Assets:  Communication Skills Desire for Improvement Financial Resources/Insurance Physical Health Resilience Social Support  ADL's:  Intact  Cognition:  WNL  Sleep:  Number of Hours: 6.15      COGNITIVE FEATURES THAT CONTRIBUTE TO RISK:  None    SUICIDE RISK:   Moderate:  Frequent suicidal ideation with limited intensity, and duration, some specificity in terms of plans, no associated intent, good self-control, limited dysphoria/symptomatology, some risk factors present, and identifiable protective factors, including available and accessible social support.   PLAN OF CARE: Hospital admission, medication management, discharge planning.  Christina Case is an 20 year old female with a history of depression and ODD admitted for suicidal ideation in the context of social stressors.   1. Suicidal ideation. The patient is able to contract for safety in the hospital.  2. Mood. We continued Wellbutrin for depression.  3. Insomnia. She is on trazodone.  4. Disposition. To be established.  I certify that inpatient services furnished can reasonably be expected to improve the patient's condition.  Kristine LineaJolanta Najiyah Paris, MD 07/15/2016, 8:24 AM

## 2016-07-15 NOTE — [facility] Group Notes (Signed)
[facility] Group Notes:  (Nursing/MHT/Case Management/Adjunct)  Date:  07/15/2016  Time:  10:00 PM  Type of Therapy:  Group Therapy  Participation Level:  Did Not Attend    [name] [name] 07/15/2016, 10:00 PM

## 2016-07-15 NOTE — H&P (Signed)
Psychiatric Admission Assessment Adult  Patient Identification: Christina Case MRN:  [id number] Date of Evaluation:  07/15/2016 Chief Complaint:  major depressive recurrent Principal Diagnosis: Major depressive disorder, recurrent severe without psychotic features (HCC) Diagnosis:   Patient Active Problem List   Diagnosis Date Noted  . PTSD (post-traumatic stress disorder) [F43.10] 07/15/2016  . OCD (obsessive compulsive disorder) [F42.9] 07/15/2016  . Suicidal ideation [R45.851] 07/15/2016  . Major depressive disorder, recurrent severe without psychotic features (HCC) [F33.2] 07/14/2016   History of Present Illness:   Identifying data. Christina Case is an 20 year old female with history of depression and anxiety.  Chief complaint. "My parents kicked me out on Monday."  History of present illness. Information was obtained from the patient and the chart. The patient has a history of depression that begun when in high school. She did not receive any treatment until she was hospitalized in June of this year in [place] after suicide attempt by overdose. She was prescribed that she did not take. It was partly because she dislikes medication but also her follow-up appointment with Dr. Lafayette Dragon was scheduled in the fall and the patient did not have enough refills so she decided to stop it. She returns to the hospital complaining of suicidal the past 2 days. She did not attempt to hurt herself this time. Her parents kicked out of the house a week ago for arguing and being disrespectful. There is a long-standing conflict with the parents it was exacerbated by the fact that the patient did not do well at [employer]. Her fall semester last year was close to the disaster. In the spring she did not go to classes at all. As she did receive some counseling at [facility] but no medication was recommended. For the past week she has been staying with friends but is not sure if she can return there. She reports many  symptoms of depression with decreased appetite and weight loss, anhedonia, feeling of guilt and hopelessness worthlessness, poor energy and concentration, and social isolation that culminated in suicidal thinking with a plan to overdose on medications. She denies insomnia or crying spells. She denies psychotic symptoms or symptoms suggestive of bipolar mania. She reports nightmares and flashbacks suggestive of PTSD. She is not forthcoming about events leading to her symptoms but told me that he had been in high school. She believes that especially during spring semester she was troubled by PTSD symptoms. She also has periods when she experiences also a type of symptoms. She had some problems with alcohol in high school but no longer drinks or uses drugs. Her urine tox screen was negative for substances. The patient denies that alcohol or substances contributed to her poor performance in school.   Past psychiatric history. She was hospitalized once in [facility] in June and was prescribed Wellbutrin. This was after suicide attempt by caffeine pill overdose. She did not follow-up with a psychiatrist as the closest appointment was in October. She was noncompliant with medications. She attempted therapy but could not afford her co-pay of $25. 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 The patient denies any symptoms and was unaware of her prior diagnoses.  Family psychiatric history. Nonreported.  Social history. She graduated from high school. She failed spring semester at [employer]. She no longer is allowed to live with her parents although this is not the first time they kicked her out. She works at the [employer] and finds  it enjoyable.  Total Time spent with patient: 1 hour  Is the patient at risk to self? Yes.    Has the patient been a risk to self in the past 6 months? Yes.    Has the patient been a risk to self  within the distant past? No.  Is the patient a risk to others? No.  Has the patient been a risk to others in the past 6 months? No.  Has the patient been a risk to others within the distant past? No.   Prior Inpatient Therapy:   Prior Outpatient Therapy:    Alcohol Screening: Patient refused Alcohol Screening Tool: Yes 1. How often do you have a drink containing alcohol?: Never Brief Intervention: Patient declined brief intervention Substance Abuse History in the last 12 months:  No. Consequences of Substance Abuse: NA Previous Psychotropic Medications: Yes  Psychological Evaluations: Yes  Past Medical History:  Past Medical History:  Diagnosis Date  . ODD (oppositional defiant disorder)    History reviewed. No pertinent surgical history. Family History: History reviewed. No pertinent family history.  Tobacco Screening: Have you used any form of tobacco in the last 30 days? (Cigarettes, Smokeless Tobacco, Cigars, and/or Pipes): No Social History:  History  Alcohol Use  . Yes    Comment: ocassionally     History  Drug Use No    Additional Social History:                           Allergies:   Allergies  Allergen Reactions  . Hydrocodone Other (See Comments)    dizziness  . Oxycodone Other (See Comments)    dizziness  . Zofran [Ondansetron Hcl] Nausea And Vomiting   Lab Results:  Results for orders placed or performed during the hospital encounter of 07/14/16 (from the past 48 hour(s))  Urine Drug Screen, Qualitative     Status: None   Collection Time: 07/15/16  7:30 AM  Result Value Ref Range   Tricyclic, Ur Screen NONE DETECTED NONE DETECTED   Amphetamines, Ur Screen NONE DETECTED NONE DETECTED   MDMA (Ecstasy)Ur Screen NONE DETECTED NONE DETECTED   Cocaine Metabolite,Ur [place] NONE DETECTED NONE DETECTED   Opiate, Ur Screen NONE DETECTED NONE DETECTED   Phencyclidine (PCP) Ur S NONE DETECTED NONE DETECTED   Cannabinoid 50 Ng, Ur [place] NONE DETECTED NONE  DETECTED   Barbiturates, Ur Screen NONE DETECTED NONE DETECTED   Benzodiazepine, Ur Scrn NONE DETECTED NONE DETECTED   Methadone Scn, Ur NONE DETECTED NONE DETECTED    Comment: (NOTE) 100  Tricyclics, urine               Cutoff 1000 ng/mL 200  Amphetamines, urine             Cutoff 1000 ng/mL 300  MDMA (Ecstasy), urine           Cutoff 500 ng/mL 400  Cocaine Metabolite, urine       Cutoff 300 ng/mL 500  Opiate, urine                   Cutoff 300 ng/mL 600  Phencyclidine (PCP), urine      Cutoff 25 ng/mL 700  Cannabinoid, urine              Cutoff 50 ng/mL 800  Barbiturates, urine             Cutoff 200 ng/mL 900  Benzodiazepine, urine  Cutoff 200 ng/mL 1000 Methadone, urine                Cutoff 300 ng/mL 1100 1200 The urine drug screen provides only a preliminary, unconfirmed 1300 analytical test result and should not be used for non-medical 1400 purposes. Clinical consideration and professional judgment should 1500 be applied to any positive drug screen result due to possible 1600 interfering substances. A more specific alternate chemical method 1700 must be used in order to obtain a confirmed analytical result.  1800 Gas chromato graphy / mass spectrometry (GC/MS) is the preferred 1900 confirmatory method.     Blood Alcohol level:  Lab Results  Component Value Date   [facility] <5 07/13/2016   ETH <11 03/01/2013    Metabolic Disorder Labs:  Lab Results  Component Value Date   HGBA1C 5.1 03/03/2013   MPG 100 03/03/2013   Lab Results  Component Value Date   PROLACTIN 14.2 03/03/2013   Lab Results  Component Value Date   CHOL 188 (H) 03/03/2013   TRIG 34 03/03/2013   HDL 63 03/03/2013   CHOLHDL 3.0 03/03/2013   VLDL 7 03/03/2013   LDLCALC 118 (H) 03/03/2013    Current Medications: Current Facility-Administered Medications  Medication Dose Route Frequency Provider Last Rate Last Dose  . acetaminophen (TYLENOL) tablet 650 mg  650 mg Oral Q6H PRN Jimmy Footman, MD      . alum & mag hydroxide-simeth (MAALOX/MYLANTA) 200-200-20 MG/5ML suspension 30 mL  30 mL Oral Q4H PRN Jimmy Footman, MD      . Melene Muller ON 07/16/2016] buPROPion (WELLBUTRIN XL) 24 hr tablet 150 mg  150 mg Oral Daily Duquan Gillooly B Alta Goding, MD      . fluvoxaMINE (LUVOX) tablet 50 mg  50 mg Oral QHS Mynor Witkop B Duke Weisensel, MD      . loratadine (CLARITIN) tablet 10 mg  10 mg Oral Daily Jimmy Footman, MD   10 mg at 07/14/16 2151  . magnesium hydroxide (MILK OF MAGNESIA) suspension 30 mL  30 mL Oral Daily PRN Jimmy Footman, MD      . prazosin (MINIPRESS) capsule 2 mg  2 mg Oral BID Eiliana Drone B Dyllon Henken, MD      . traZODone (DESYREL) tablet 50 mg  50 mg Oral QHS PRN Jimmy Footman, MD       PTA Medications: Prescriptions Prior to Admission  Medication Sig Dispense Refill Last Dose  . buPROPion (WELLBUTRIN XL) 300 MG 24 hr tablet Take 1 tablet (300 mg total) by mouth daily. 30 tablet 0   . diclofenac (VOLTAREN) 75 MG EC tablet Take 75 mg by mouth 2 (two) times daily.   2 weeks  . diclofenac (VOLTAREN) 75 MG EC tablet Take 1 tablet (75 mg total) by mouth 2 (two) times daily. Patient may resume home supply.     . loratadine (CLARITIN) 10 MG tablet Take 10 mg by mouth daily.   02/27/13  . loratadine (CLARITIN) 10 MG tablet Take 1 tablet (10 mg total) by mouth daily. Patient may resume home supply.       Musculoskeletal: Strength & Muscle Tone: within normal limits Gait & Station: normal Patient leans: N/A  Psychiatric Specialty Exam: Physical Exam  Nursing note and vitals reviewed. Constitutional: She is oriented to person, place, and time. She appears well-developed and well-nourished.  HENT:  Head: Normocephalic and atraumatic.  Eyes: Conjunctivae and EOM are normal. Pupils are equal, round, and reactive to light.  Neck: Normal range of motion. Neck supple.  Cardiovascular:  Normal rate, regular rhythm and normal heart sounds.    Respiratory: Effort normal and breath sounds normal.  GI: Soft. Bowel sounds are normal.  Musculoskeletal: Normal range of motion.  Neurological: She is alert and oriented to person, place, and time.  Skin: Skin is warm and dry.    Review of Systems  Psychiatric/Behavioral: Positive for depression and suicidal ideas.  All other systems reviewed and are negative.   Blood pressure 118/73, pulse 88, temperature 98 F (36.7 C), temperature source Oral, resp. rate 18, height 5\' 5"  (1.651 m), weight 63.5 kg (140 lb), last menstrual period 07/04/2016, SpO2 99 %.Body mass index is 23.3 kg/m.  See SRA.                                                  Sleep:  Number of Hours: 6.15       Treatment Plan Summary: Daily contact with patient to assess and evaluate symptoms and progress in treatment and Medication management   [name] is an 20 year old female with a history of depression and ODD admitted for suicidal ideation in the context of social stressors.   1. Suicidal ideation. The patient is able to contract for safety in the hospital.  2. Mood. We continued Wellbutrin for depression.   3. Anxiety. We will try Luvox for OCD symptoms and Minipress for PTSD.  4. Disposition. To be established.   Observation Level/Precautions:  15 minute checks  Laboratory:  CBC Chemistry Profile UDS UA  Psychotherapy:    Medications:    Consultations:    Discharge Concerns:    Estimated LOS:  Other:     I certify that inpatient services furnished can reasonably be expected to improve the patient's condition.    Kristine LineaJolanta Verl Kitson, MD 8/28/20179:19 AM

## 2016-07-15 NOTE — Progress Notes (Signed)
Pt denies SI/AVH. Forwards little. Writer attempted to speak to Pt about contacting her parents but Pt refused. Offers little detail into her relationship with family other than to state "they think I am disrespectful". Encouragement and support provided. Attended wrap up group outside and was seen socializing in the day room. Denied pain and voices no additional concerns at this time. Safety maintained.

## 2016-07-15 NOTE — Plan of Care (Signed)
Problem: Coping: Goal: Ability to verbalize feelings will improve Outcome: Not Progressing Pt is isolative and forwards little information on approach. She covers her head with the blanket.

## 2016-07-15 NOTE — Progress Notes (Signed)
Patient was sad and depressed this morning.Denies suicidal and homicidal ideations.Stated that she does not want to contact her parents for anything since they kicked her out.Patient rated her depression 6/10 and her anxiety 3/10.Visible in the milieu.Appropriate with staff & peers.Compliant with medications.

## 2016-07-15 NOTE — [facility] Counselor (Signed)
Adult Comprehensive Assessment  Patient ID: Christina Case, female   DOB: March 01, 1996, 20 y.o.   MRN: [id number]  Information Source: Information source: Patient  Current Stressors:  Educational / Learning stressors: Pt "failed out of school", per the pt Employment / Job issues: Pt is employed, has been promoted but will not receive extra pay for at least another month.  Pt works part-time Family Relationships: Pt has relationship issues and conflicts with family members over school and various other Research scientist (life sciences)things Financial / Lack of resources (include bankruptcy): Pt reports her stressors are relationship issues, worries over job and school, as well as Tour managerfinancial worries Housing / Lack of housing: Pt is currently homeless if family will not let her move in and pt prefers not to move back home Physical health (include injuries & life threatening diseases): Pt denies Social relationships: Pt denies Substance abuse: Pt denies Bereavement / Loss: Pt denies  Living/Environment/Situation:  Living Arrangements: Parent Living conditions (as described by patient or guardian): Pt lives with mother and father but reports she was "kicked out twice" over the past year but lived in [place] [facility], pt is now not in school How long has patient lived in current situation?: Pt has been "couch hopping" for approx a week What is atmosphere in current home: Chaotic  Family History:  Marital status: Single Does patient have children?: No  Childhood History:  By whom was/is the patient raised?: Both parents Description of patient's relationship with caregiver when they were a child: Good childhood initially, now pt reports is "less so" Patient's description of current relationship with people who raised him/her: Pt does "not talk to them". Does patient have siblings?: Yes Number of Siblings: 4 Description of patient's current relationship with siblings: Pt's relationship with all is "pretty rocky" Did patient  suffer any verbal/emotional/physical/sexual abuse as a child?: No Did patient suffer from severe childhood neglect?: No Has patient ever been sexually abused/assaulted/raped as an adolescent or adult?: No Was the patient ever a victim of a crime or a disaster?: No Witnessed domestic violence?: No Has patient been effected by domestic violence as an adult?: No  Education:  Highest grade of school patient has completed: 12th grade Currently a student?: No Name of school: [employer], just "kicked out" Learning disability?: Yes (Pt suspects yes, but was never tested) What learning problems does patient have?: Difficulty reading  Employment/Work Situation:   Employment situation: Employed Where is patient currently employed?: [employer] How long has patient been employed?: over a month Patient's job has been impacted by current illness: No What is the longest time patient has a held a job?: 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  Pt denies the use of marijuana and other substances If attempted suicide, did drugs/alcohol play a role in this?: No Alcohol/Substance Abuse Treatment Hx: Denies past history Has alcohol/substance abuse ever caused legal problems?: No  Social Support System:   Patient's Community Support System: Fair Describe Community Support System: Pt has alot of good friends Type of faith/religion: Pt  reports "I don't know how to answer that" How does patient's faith help to cope with current illness?:  n/a  Leisure/Recreation:   Leisure and Hobbies: Hiking  Strengths/Needs:   What things does the patient do well?: Sports, writing good at music In what areas does patient struggle / problems for patient: Pt does not" know exactly"  Discharge Plan:   Does patient have access to transportation?: Yes Plan for no access to transportation at discharge: Pt has a friend Will patient be returning to same living situation after discharge?: No Plan for living situation after discharge: Pt has no plans for now Currently receiving community mental health services:  ([facility] in [place]) If no, would patient like referral for services when discharged?: Yes (What [place] ) Does patient have financial barriers related to discharge medications?: Yes (Lack of income) Patient description of barriers related to discharge medications: Patient presented to the hospital at the suggestion of friends was admitted for suicidal ideations.  Pt's primary diagnosis is Major depressive disorder, recurrent severe without psychotic features (HCC).  Pt reports primary triggers for admission were relationship conflicts with family members.  Pt reports her stressors are relationship issues, worries over job and school, as well as financial worries.  Pt now denies SI/HI/AVH.  Patient lives in [place], [place].  Pt lists supports in the community as her large circle of friends.  Patient will benefit from crisis stabilization, medication evaluation, group therapy, and psycho education in addition to case management for discharge planning. Patient and CSW reviewed pt's identified goals and treatment plan. Pt verbalized understanding and agreed to treatment plan.  At discharge it is recommended that patient remain compliant with established plan and continue treatment.  Summary/Recommendations:   Summary and Recommendations (to be completed by the evaluator): Patient presented  to the hospital at the suggestion of friends was admitted for suicidal ideations.  Pt's primary diagnosis is Major depressive disorder, recurrent severe without psychotic features (HCC).  Pt reports primary triggers for admission were relationship conflicts with family members.  Pt reports her stressors are relationship issues, worries over job and school, as well as financial worries.  Pt now denies SI/HI/AVH.  Patient lives in [place], [place].  Pt lists supports in the community as her large circle of friends.  Patient will benefit from crisis stabilization, medication evaluation, group therapy, and psycho education in addition to case management for discharge planning. Patient and CSW reviewed pt's identified goals and treatment plan. Pt verbalized understanding and agreed to treatment plan.  At discharge it is recommended that patient remain compliant with established plan and continue treatment.  Dorothe Pea Rossie Scarfone. 07/15/2016

## 2016-07-15 NOTE — Progress Notes (Signed)
Recreation Therapy Notes  Date: 08.28.17 Time: 1:00 pm Location: Craft Room  Group Topic: Wellness  Goal Area(s) Addresses:  Patient will identify at least one item per dimension of health. Patient will examine areas they are deficient in.  Behavioral Response: Attentive  Intervention: 6 Dimensions of Health  Activity: Patients were given a definition sheet with the 6 dimensions of health on it. Patient were given a worksheet with the 6 dimensions of health and were instructed to write 2-3 things they were currently doing in each dimension.  Education: LRT educated patients on ways they can improve each dimension.  Education Outcome: Acknowledges education/In group clarification offered  Clinical Observations/Feedback: Patient completed activity by writing at least 2 items in 3 of the dimensions. Patient did not contribute to group discussion.  Jacquelynn CreeGreene,Amato Sevillano M, LRT/CTRS 07/15/2016 4:04 PM

## 2016-07-16 MED ORDER — FLUVOXAMINE MALEATE 50 MG PO TABS
100.0000 mg | ORAL_TABLET | Freq: Every day | ORAL | Status: DC
  Administered 2016-07-16: 100 mg via ORAL
  Filled 2016-07-16: qty 2

## 2016-07-16 NOTE — Progress Notes (Signed)
Recreation Therapy Notes  INPATIENT RECREATION TR PLAN  Patient Details Name: Christina Case MRN: [id number] DOB: October 29, 1996 Today's Date: 07/16/2016  Rec Therapy Plan Is patient appropriate for Therapeutic Recreation?: Yes Treatment times per week: At least once a week TR Treatment/Interventions: 1:1 session, Group participation (Comment) (Appropriate participation in daily recreational therapy tx)  Discharge Criteria Pt will be discharged from therapy if:: Treatment goals are met, Discharged Treatment plan/goals/alternatives discussed and agreed upon by:: Patient/family  Discharge Summary Short term goals set: See Care Plan Short term goals met: Complete, Adequate for discharge Progress toward goals comments: One-to-one attended Which groups?: Wellness, Other (Comment) (Self-expression) One-to-one attended: Self-esteem, stress management Reason goals not met: N/A Therapeutic equipment acquired: None Reason patient discharged from therapy: Treatment goals met Pt/family agrees with progress & goals achieved: Yes Date patient discharged from therapy: 07/16/16   Leonette Monarch, LRT/CTRS  07/16/2016, 5:04 PM

## 2016-07-16 NOTE — Plan of Care (Signed)
Problem: [facility] Participation in Recreation Therapeutic Interventions Goal: STG-Patient will demonstrate improved self esteem by identif STG: Self-Esteem - Within 3 treatment sessions, patient will verbalize at least 5 positive affirmation statements in one treatment session to increase self-esteem post d/c.  Outcome: Adequate for Discharge Treatment Session 1; Completed 0 out of 1: At approximately 1:50 pm, LRT met with patient in craft room. Patient helped think of 2 positive traits. LRT provided patient with 3 others. Patient refused to say them out loud because she did not believe them. LRT encouraged patient to say positive affirmation statements to help her feel better about herself.  Leonette Monarch, LRT/CTRS 08.29.17 2:18 pm Goal: STG-Other Recreation Therapy Goal (Specify) STG: Stress Management - Within 3 treatment sessions, patient will verbalize understanding of the stress management techniques in one treatment session to increase stress management skills post d/c.  Outcome: Completed/Met Date Met: 07/16/16 Treatment Session 1; Completed 1 out of 1: At approximately 1:50 pm, LRT met with patient in craft room. LRT educated and provided patient with handouts on stress management techniques. Patient verbalized understanding. LRT encouraged patient to read over and practice the stress management techniques.  Leonette Monarch, LRT/CTRS 08.29.17 2:19 pm

## 2016-07-16 NOTE — [facility] Group Notes (Signed)
[facility] Group Notes:  (Nursing/MHT/Case Management/Adjunct)  Date:  07/16/2016  Time:  1:58 PM  Type of Therapy:  Psychoeducational Skills  Participation Level:  Active  Participation Quality:  Appropriate, Attentive and Supportive  Affect:  Appropriate  Cognitive:  Appropriate  Insight:  Appropriate  Engagement in Group:  Engaged and Supportive  Modes of Intervention:  Discussion and Education  Summary of Progress/Problems:  Christina Case 07/16/2016, 1:58 PM

## 2016-07-16 NOTE — Plan of Care (Signed)
Problem: Education: Goal: Emotional status will improve Outcome: Progressing Pt reports improved mood this evening. Her affect is brighter than previous night.  Problem: Safety: Goal: Ability to disclose and discuss suicidal ideas will improve Outcome: Progressing Pt rates depression 8/10. She denies SI at this time.

## 2016-07-16 NOTE — Progress Notes (Signed)
Affect flat.  Denies SI/HI/AVH.  Denies Depression.  Unable to verbalize a goal for today.  Unable to formulate plan for discharge at this time.  Medication and group compliant.  Support and encouragement offered.  Safety maintained.

## 2016-07-16 NOTE — Progress Notes (Signed)
[facility] MD Progress Note  07/16/2016 1:58 PM Christina Case  MRN:  [id number]  Subjective:  [name] feels no better since yesterday. She is still depressed and has passing suicidal thoughts. She met with treatment team today. The patient is unable to participate in discharge planning. She is adamant that we do not contact her parents. She started participating in programming. She tolerates Luvox well enough so we can increase the dose. The patient has absolutely no place to go. She does not have enough resources to cover her co-pay for medical services.  Principal Problem: Major depressive disorder, recurrent severe without psychotic features (HCC) Diagnosis:   Patient Active Problem List   Diagnosis Date Noted  . PTSD (post-traumatic stress disorder) [F43.10] 07/15/2016  . OCD (obsessive compulsive disorder) [F42.9] 07/15/2016  . Suicidal ideation [R45.851] 07/15/2016  . Major depressive disorder, recurrent severe without psychotic features (HCC) [F33.2] 07/14/2016   Total Time spent with patient: 20 minutes  Past Psychiatric History: Depression, anxiety.  Past Medical History:  Past Medical History:  Diagnosis Date  . ODD (oppositional defiant disorder)    History reviewed. No pertinent surgical history. Family History: History reviewed. No pertinent family history. Family Psychiatric  History: See H&P. Social History:  History  Alcohol Use  . Yes    Comment: ocassionally     History  Drug Use No    Social History   Social History  . Marital status: Single    Spouse name: N/A  . Number of children: N/A  . Years of education: N/A   Social History Main Topics  . Smoking status: Never Smoker  . Smokeless tobacco: Never Used  . Alcohol use Yes     Comment: ocassionally  . Drug use: No  . Sexual activity: Not Asked   Other Topics Concern  . None   Social History Narrative  . None   Additional Social History:                         Sleep:  Fair  Appetite:  Fair  Current Medications: Current Facility-Administered Medications  Medication Dose Route Frequency Provider Last Rate Last Dose  . acetaminophen (TYLENOL) tablet 650 mg  650 mg Oral Q6H PRN Jimmy Footman, MD      . alum & mag hydroxide-simeth (MAALOX/MYLANTA) 200-200-20 MG/5ML suspension 30 mL  30 mL Oral Q4H PRN Jimmy Footman, MD      . buPROPion (WELLBUTRIN XL) 24 hr tablet 150 mg  150 mg Oral Daily Shari Prows, MD   150 mg at 07/16/16 0910  . fluvoxaMINE (LUVOX) tablet 50 mg  50 mg Oral QHS Jolanta B Pucilowska, MD      . loratadine (CLARITIN) tablet 10 mg  10 mg Oral Daily Jimmy Footman, MD   10 mg at 07/16/16 0910  . magnesium hydroxide (MILK OF MAGNESIA) suspension 30 mL  30 mL Oral Daily PRN Jimmy Footman, MD      . prazosin (MINIPRESS) capsule 2 mg  2 mg Oral BID Shari Prows, MD   2 mg at 07/16/16 0910  . traZODone (DESYREL) tablet 50 mg  50 mg Oral QHS PRN Jimmy Footman, MD        Lab Results:  Results for orders placed or performed during the hospital encounter of 07/14/16 (from the past 48 hour(s))  Urine Drug Screen, Qualitative     Status: None   Collection Time: 07/15/16  7:30 AM  Result Value Ref Range  Tricyclic, Ur Screen NONE DETECTED NONE DETECTED   Amphetamines, Ur Screen NONE DETECTED NONE DETECTED   MDMA (Ecstasy)Ur Screen NONE DETECTED NONE DETECTED   Cocaine Metabolite,Ur [place] NONE DETECTED NONE DETECTED   Opiate, Ur Screen NONE DETECTED NONE DETECTED   Phencyclidine (PCP) Ur S NONE DETECTED NONE DETECTED   Cannabinoid 50 Ng, Ur [place] NONE DETECTED NONE DETECTED   Barbiturates, Ur Screen NONE DETECTED NONE DETECTED   Benzodiazepine, Ur Scrn NONE DETECTED NONE DETECTED   Methadone Scn, Ur NONE DETECTED NONE DETECTED    Comment: (NOTE) 939  Tricyclics, urine               Cutoff 1000 ng/mL 200  Amphetamines, urine             Cutoff 1000 ng/mL 300  MDMA (Ecstasy),  urine           Cutoff 500 ng/mL 400  Cocaine Metabolite, urine       Cutoff 300 ng/mL 500  Opiate, urine                   Cutoff 300 ng/mL 600  Phencyclidine (PCP), urine      Cutoff 25 ng/mL 700  Cannabinoid, urine              Cutoff 50 ng/mL 800  Barbiturates, urine             Cutoff 200 ng/mL 900  Benzodiazepine, urine           Cutoff 200 ng/mL 1000 Methadone, urine                Cutoff 300 ng/mL 1100 1200 The urine drug screen provides only a preliminary, unconfirmed 1300 analytical test result and should not be used for non-medical 1400 purposes. Clinical consideration and professional judgment should 1500 be applied to any positive drug screen result due to possible 1600 interfering substances. A more specific alternate chemical method 1700 must be used in order to obtain a confirmed analytical result.  1800 Gas chromato graphy / mass spectrometry (GC/MS) is the preferred 1900 confirmatory method.     Blood Alcohol level:  Lab Results  Component Value Date   [facility] <5 07/13/2016   ETH <11 03/00/9233    Metabolic Disorder Labs: Lab Results  Component Value Date   HGBA1C 5.1 03/03/2013   MPG 100 03/03/2013   Lab Results  Component Value Date   PROLACTIN 14.2 03/03/2013   Lab Results  Component Value Date   CHOL 188 (H) 03/03/2013   TRIG 34 03/03/2013   HDL 63 03/03/2013   CHOLHDL 3.0 03/03/2013   VLDL 7 03/03/2013   LDLCALC 118 (H) 03/03/2013    Physical Findings: AIMS:  , ,  ,  ,    CIWA:    COWS:     Musculoskeletal: Strength & Muscle Tone: within normal limits Gait & Station: normal Patient leans: N/A  Psychiatric Specialty Exam: Physical Exam  Nursing note and vitals reviewed.   Review of Systems  Psychiatric/Behavioral: Positive for depression and suicidal ideas.  All other systems reviewed and are negative.   Blood pressure 112/72, pulse 92, temperature 98 F (36.7 C), temperature source Oral, resp. rate 18, height '5\' 5"'$  (1.651 m),  weight 63.5 kg (140 lb), last menstrual period 07/04/2016, SpO2 99 %.Body mass index is 23.3 kg/m.  General Appearance: Casual  Eye Contact:  Good  Speech:  Clear and Coherent  Volume:  Normal  Mood:  Anxious, Depressed and  Hopeless  Affect:  Appropriate  Thought Process:  Goal Directed  Orientation:  Full (Time, Place, and Person)  Thought Content:  WDL  Suicidal Thoughts:  Yes.  with intent/plan  Homicidal Thoughts:  No  Memory:  Immediate;   Fair Recent;   Fair Remote;   Fair  Judgement:  Impaired  Insight:  Shallow  Psychomotor Activity:  Normal  Concentration:  Concentration: Fair and Attention Span: Fair  Recall:  [employer] of Knowledge:  Fair  Language:  Fair  Akathisia:  No  Handed:  Right  AIMS (if indicated):     Assets:  Communication Skills Desire for Improvement Financial Resources/Insurance Physical Health Resilience Social Support  ADL's:  Intact  Cognition:  WNL  Sleep:  Number of Hours: 8.25     Treatment Plan Summary: Daily contact with patient to assess and evaluate symptoms and progress in treatment and Medication management   Ms. Pratt is an 20 year old female with a history of depression and ODD admitted for suicidal ideation in the context of social stressors.   1. Suicidal ideation. The patient is able to contract for safety in the hospital.  2. Mood. We continued Wellbutrin for depression.   3. Anxiety. We will increase Luvox to 100 mg for OCD symptoms and continue Minipress for PTSD.  4. Disposition. To be established.   Orson Slick, MD 07/16/2016, 1:58 PM

## 2016-07-16 NOTE — Tx Team (Signed)
Interdisciplinary Treatment and Diagnostic Plan Update  07/16/2016 Time of Session: 10:24 AM  AVIANAH PELLMAN MRN: [id number]  Principal Diagnosis: Major depressive disorder, recurrent severe without psychotic features (HCC)  Secondary Diagnoses: Principal Problem:   Major depressive disorder, recurrent severe without psychotic features (HCC) Active Problems:   PTSD (post-traumatic stress disorder)   OCD (obsessive compulsive disorder)   Suicidal ideation   Current Medications:  Current Facility-Administered Medications  Medication Dose Route Frequency Provider Last Rate Last Dose  . acetaminophen (TYLENOL) tablet 650 mg  650 mg Oral Q6H PRN Jimmy Footman, MD      . alum & mag hydroxide-simeth (MAALOX/MYLANTA) 200-200-20 MG/5ML suspension 30 mL  30 mL Oral Q4H PRN Jimmy Footman, MD      . buPROPion (WELLBUTRIN XL) 24 hr tablet 150 mg  150 mg Oral Daily Shari Prows, MD   150 mg at 07/16/16 0910  . fluvoxaMINE (LUVOX) tablet 50 mg  50 mg Oral QHS Jolanta B Pucilowska, MD      . loratadine (CLARITIN) tablet 10 mg  10 mg Oral Daily Jimmy Footman, MD   10 mg at 07/16/16 0910  . magnesium hydroxide (MILK OF MAGNESIA) suspension 30 mL  30 mL Oral Daily PRN Jimmy Footman, MD      . prazosin (MINIPRESS) capsule 2 mg  2 mg Oral BID Shari Prows, MD   2 mg at 07/16/16 0910  . traZODone (DESYREL) tablet 50 mg  50 mg Oral QHS PRN Jimmy Footman, MD        PTA Medications: Prescriptions Prior to Admission  Medication Sig Dispense Refill Last Dose  . buPROPion (WELLBUTRIN XL) 300 MG 24 hr tablet Take 1 tablet (300 mg total) by mouth daily. 30 tablet 0   . diclofenac (VOLTAREN) 75 MG EC tablet Take 75 mg by mouth 2 (two) times daily.   2 weeks  . diclofenac (VOLTAREN) 75 MG EC tablet Take 1 tablet (75 mg total) by mouth 2 (two) times daily. Patient may resume home supply.     . loratadine (CLARITIN) 10 MG tablet Take 10 mg  by mouth daily.   02/27/13  . loratadine (CLARITIN) 10 MG tablet Take 1 tablet (10 mg total) by mouth daily. Patient may resume home supply.       Treatment Modalities: Medication Management, Group therapy, Case management,  1 to 1 session with clinician, Psychoeducation, Recreational therapy.   Physician Treatment Plan for Primary Diagnosis: Major depressive disorder, recurrent severe without psychotic features (HCC) Long Term Goal(s): Improvement in symptoms so as ready for discharge  Short Term Goals: Ability to identify changes in lifestyle to reduce recurrence of condition will improve, Ability to verbalize feelings will improve, Ability to disclose and discuss suicidal ideas, Ability to demonstrate self-control will improve, Ability to identify and develop effective coping behaviors will improve and Ability to identify triggers associated with substance abuse/mental health issues will improve  Medication Management: Evaluate patient's response, side effects, and tolerance of medication regimen.  Therapeutic Interventions: 1 to 1 sessions, Unit Group sessions and Medication administration.  Evaluation of Outcomes: Progressing  Physician Treatment Plan for Secondary Diagnosis: Principal Problem:   Major depressive disorder, recurrent severe without psychotic features (HCC) Active Problems:   PTSD (post-traumatic stress disorder)   OCD (obsessive compulsive disorder)   Suicidal ideation   Long Term Goal(s): Improvement in symptoms so as ready for discharge  Short Term Goals: Ability to identify changes in lifestyle to reduce recurrence of condition will improve, Ability to  verbalize feelings will improve, Ability to demonstrate self-control will improve, Compliance with prescribed medications will improve and Ability to identify triggers associated with substance abuse/mental health issues will improve  Medication Management: Evaluate patient's response, side effects, and tolerance  of medication regimen.  Therapeutic Interventions: 1 to 1 sessions, Unit Group sessions and Medication administration.  Evaluation of Outcomes: Progressing   RN Treatment Plan for Primary Diagnosis: Major depressive disorder, recurrent severe without psychotic features (HCC) Long Term Goal(s): Knowledge of disease and therapeutic regimen to maintain health will improve  Short Term Goals: Ability to participate in decision making will improve, Ability to verbalize feelings will improve, Ability to identify and develop effective coping behaviors will improve and Compliance with prescribed medications will improve  Medication Management: RN will administer medications as ordered by provider, will assess and evaluate patient's response and provide education to patient for prescribed medication. RN will report any adverse and/or side effects to prescribing provider.  Therapeutic Interventions: 1 on 1 counseling sessions, Psychoeducation, Medication administration, Evaluate responses to treatment, Monitor vital signs and CBGs as ordered, Perform/monitor CIWA, COWS, AIMS and Fall Risk screenings as ordered, Perform wound care treatments as ordered.  Evaluation of Outcomes: Progressing   LCSW Treatment Plan for Primary Diagnosis: Major depressive disorder, recurrent severe without psychotic features (HCC) Long Term Goal(s): Safe transition to appropriate next level of care at discharge, Engage patient in therapeutic group addressing interpersonal concerns.  Short Term Goals: Engage patient in aftercare planning with referrals and resources, Increase social support, Increase ability to appropriately verbalize feelings, Increase emotional regulation, Facilitate acceptance of mental health diagnosis and concerns, Facilitate patient progression through stages of change regarding substance use diagnoses and concerns, Identify triggers associated with mental health/substance abuse issues and Increase skills  for wellness and recovery  Therapeutic Interventions: Assess for all discharge needs, 1 to 1 time with Social worker, Explore available resources and support systems, Assess for adequacy in community support network, Educate family and significant other(s) on suicide prevention, Complete Psychosocial Assessment, Interpersonal group therapy.  Evaluation of Outcomes: Progressing   Progress in Treatment: Attending groups: No Participating in groups: No Taking medication as prescribed: Yes, MD continues to assess for medication changes as needed Toleration medication: Yes, no side effects reported at this time Family/Significant other contact made: CSW, still assessing for appropriate contacts Patient understands diagnosis: Yes Discussing patient identified problems/goals with staff: Yes Medical problems stabilized or resolved: Yes Denies suicidal/homicidal ideation: Yes Issues/concerns per patient self-inventory: None Other: N/A  New problem(s) identified: None identified at this time.   New Short Term/Long Term Goal(s): None identified at this time.   Discharge Plan or Barriers: CSW still assessing for appropriate referrals  Reason for Continuation of Hospitalization: Anxiety Delusions  Depression Hallucinations Homicidal ideation Mania Medical Issues  Suicidal ideation Withdrawal symptoms  Date of discharge: 3-5 days  Attendees: Patient: Jillyn LedgerCaitlin Hudgins 07/16/2016  9:40 AM  Physician: Dr. Jennet MaduroPucilowska, MD 07/16/2016  9:40 AM  Nursing: Elenore PaddyJennifer Morrow, RN 07/16/2016  9:40 AM  RN Care Manager: 07/16/2016  9:40 AM  Social Worker: Dorothe PeaJonathan F. Durward ParcelRiffey, LCSWA, LCAS    07/16/2016  9:40 AM  Recreational Therapist: Hershal CoriaBeth Greene, LRT   07/16/2016  9:40 AM  Social Worker: Jake SharkSara Laws, LCSW 07/16/2016  9:40 AM  Other:  07/16/2016  9:40 AM  Other: 07/16/2016  9:40 AM    Scribe for Treatment Team:  Dorothe PeaJonathan F. Brynnan Rodenbaugh MSW, LCSWA, LCAS

## 2016-07-16 NOTE — Progress Notes (Signed)
D: Pt is isolative to her room this evening. When writer enters, she pulls the covers over her head and refuses to respond. Writer again attempts to approach pt, who states "I just want to sleep." She refuses bedtime medication despite RN encouragement. Denies SI/HI/AVH at this time. A: Emotional support and encouragement provided. q15 minute safety checks maintained. R: Pt remains free from harm. Will continue to monitor.

## 2016-07-16 NOTE — Progress Notes (Signed)
Recreation Therapy Notes  Date: 08.29.17 Time: 3:00 pm Location: Craft Room  Group Topic: Self-expression  Goal Area(s) Addresses:  Patient will be able to identify a color that represents each emotion. Patient will verbalize benefit of using art as a means of self-expression. Patient will verbalize one emotion experienced while participating in activity.  Behavioral Response: Attentive, Interactive  Intervention: The Colors Within Me  Activity: Patients were given a blank face worksheet and were instructed to pick a color for each emotion they were feeling and to show on the face how much of that emotion they were feeling.  Education: LRT educated patients on other forms of self-expression.  Education Outcome: Acknowledges education/In group clarification offered  Clinical Observations/Feedback: Patient completed activity by picking a color for each emotion she was feeling. Patient contributed to group discussion by stating why it was helpful for her to see her emotions in color and on paper.  Jacquelynn CreeGreene,Naren Benally M, LRT/CTRS 07/16/2016 4:45 PM

## 2016-07-17 MED ORDER — PROMETHAZINE HCL 25 MG PO TABS
12.5000 mg | ORAL_TABLET | Freq: Four times a day (QID) | ORAL | Status: DC | PRN
  Filled 2016-07-17: qty 1

## 2016-07-17 MED ORDER — PROMETHAZINE HCL 25 MG/ML IJ SOLN
12.5000 mg | Freq: Once | INTRAMUSCULAR | Status: AC
  Administered 2016-07-17: 12.5 mg via INTRAMUSCULAR
  Filled 2016-07-17: qty 1

## 2016-07-17 MED ORDER — PRAZOSIN HCL 2 MG PO CAPS
2.0000 mg | ORAL_CAPSULE | Freq: Every day | ORAL | Status: DC
  Administered 2016-07-18: 2 mg via ORAL
  Filled 2016-07-17: qty 1

## 2016-07-17 MED ORDER — FLUVOXAMINE MALEATE 50 MG PO TABS
25.0000 mg | ORAL_TABLET | Freq: Every day | ORAL | Status: DC
  Administered 2016-07-17: 25 mg via ORAL
  Filled 2016-07-17: qty 1

## 2016-07-17 NOTE — Progress Notes (Signed)
D: Pt affect is brighter this evening. She continues to report depression, which she rates 8/10. Pt rates anxiety 5/10. She denies SI/HI/AVH at this time. Pt reports that her feelings of anxiety are d/t her plan for after discharge. Pt states that she would like to find somewhere in [place] because she has a job here and she is not able to drive. A: Emotional support and encouragement provided. Medications administered with education. q15 minute safety checks maintained. R: Pt remains free from harm. Will continue to monitor.

## 2016-07-17 NOTE — Progress Notes (Signed)
[facility] MD Progress Note  07/17/2016 3:51 PM Christina Case  MRN:  [id number]  Subjective:  Christina Case is still depressed, frightened, and suicidal. She did not feel well physically today she had nausea and vomiting. She took no medications had no breakfast or lunch. She is slightly better now. She did not sleep last night. She did not participate in programming in the morning but went to class in pm. She still refuses to contact her parents. The patient has absolutely no place to go. She does not have enough resources to cover her co-pay for medical services.  Principal Problem: Major depressive disorder, recurrent severe without psychotic features (HCC) Diagnosis:   Patient Active Problem List   Diagnosis Date Noted  . PTSD (post-traumatic stress disorder) [F43.10] 07/15/2016  . OCD (obsessive compulsive disorder) [F42.9] 07/15/2016  . Suicidal ideation [R45.851] 07/15/2016  . Major depressive disorder, recurrent severe without psychotic features (HCC) [F33.2] 07/14/2016   Total Time spent with patient: 20 minutes  Past Psychiatric History: Depression, anxiety.  Past Medical History:  Past Medical History:  Diagnosis Date  . ODD (oppositional defiant disorder)    History reviewed. No pertinent surgical history. Family History: History reviewed. No pertinent family history. Family Psychiatric  History: See H&P. Social History:  History  Alcohol Use  . Yes    Comment: ocassionally     History  Drug Use No    Social History   Social History  . Marital status: Single    Spouse name: N/A  . Number of children: N/A  . Years of education: N/A   Social History Main Topics  . Smoking status: Never Smoker  . Smokeless tobacco: Never Used  . Alcohol use Yes     Comment: ocassionally  . Drug use: No  . Sexual activity: Not Asked   Other Topics Concern  . None   Social History Narrative  . None   Additional Social History:                         Sleep:  Fair  Appetite:  Fair  Current Medications: Current Facility-Administered Medications  Medication Dose Route Frequency Provider Last Rate Last Dose  . acetaminophen (TYLENOL) tablet 650 mg  650 mg Oral Q6H PRN Jimmy Footman, MD      . alum & mag hydroxide-simeth (MAALOX/MYLANTA) 200-200-20 MG/5ML suspension 30 mL  30 mL Oral Q4H PRN Jimmy Footman, MD      . buPROPion (WELLBUTRIN XL) 24 hr tablet 150 mg  150 mg Oral Daily Shari Prows, MD   150 mg at 07/16/16 0910  . fluvoxaMINE (LUVOX) tablet 100 mg  100 mg Oral QHS Shari Prows, MD   100 mg at 07/16/16 2112  . loratadine (CLARITIN) tablet 10 mg  10 mg Oral Daily Jimmy Footman, MD   10 mg at 07/16/16 0910  . magnesium hydroxide (MILK OF MAGNESIA) suspension 30 mL  30 mL Oral Daily PRN Jimmy Footman, MD      . prazosin (MINIPRESS) capsule 2 mg  2 mg Oral BID Shari Prows, MD   2 mg at 07/16/16 1733  . promethazine (PHENERGAN) tablet 12.5 mg  12.5 mg Oral Q6H PRN Jimmy Footman, MD      . traZODone (DESYREL) tablet 50 mg  50 mg Oral QHS PRN Jimmy Footman, MD        Lab Results:  No results found for this or any previous visit (from the past 48  hour(s)).  Blood Alcohol level:  Lab Results  Component Value Date   [facility]   ETH <11 03/01/2013    Metabolic Disorder Labs: Lab Results  Component Value Date   HGBA1C 5.1 03/03/2013   MPG 100 03/03/2013   Lab Results  Component Value Date   PROLACTIN 14.2 03/03/2013   Lab Results  Component Value Date   CHOL 188 (H) 03/03/2013   TRIG 34 03/03/2013   HDL 63 03/03/2013   CHOLHDL 3.0 03/03/2013   VLDL 7 03/03/2013   LDLCALC 118 (H) 03/03/2013    Physical Findings: AIMS:  , ,  ,  ,    CIWA:    COWS:     Musculoskeletal: Strength & Muscle Tone: within normal limits Gait & Station: normal Patient leans: N/A  Psychiatric Specialty Exam: Physical Exam  Nursing note and  vitals reviewed.   Review of Systems  Gastrointestinal: Positive for nausea and vomiting.  Psychiatric/Behavioral: Positive for depression and suicidal ideas.  All other systems reviewed and are negative.   Blood pressure 115/70, pulse 72, temperature 98 F (36.7 C), temperature source Oral, resp. rate 18, height 5\' 5"  (1.651 m), weight 63.5 kg (140 lb), last menstrual period 07/04/2016, SpO2 99 %.Body mass index is 23.3 kg/m.  General Appearance: Casual  Eye Contact:  Good  Speech:  Clear and Coherent  Volume:  Normal  Mood:  Anxious, Depressed and Hopeless  Affect:  Appropriate  Thought Process:  Goal Directed  Orientation:  Full (Time, Place, and Person)  Thought Content:  WDL  Suicidal Thoughts:  Yes.  with intent/plan  Homicidal Thoughts:  No  Memory:  Immediate;   Fair Recent;   Fair Remote;   Fair  Judgement:  Impaired  Insight:  Shallow  Psychomotor Activity:  Normal  Concentration:  Concentration: Fair and Attention Span: Fair  Recall:  [employer]Fair  Fund of Knowledge:  Fair  Language:  Fair  Akathisia:  No  Handed:  Right  AIMS (if indicated):     Assets:  Communication Skills Desire for Improvement Financial Resources/Insurance Physical Health Resilience Social Support  ADL's:  Intact  Cognition:  WNL  Sleep:  Number of Hours: 6.75     Treatment Plan Summary: Daily contact with patient to assess and evaluate symptoms and progress in treatment and Medication management   Christina Case is an 20 year old female with a history of depression and ODD admitted for suicidal ideation in the context of social stressors.   1. Suicidal ideation. The patient is able to contract for safety in the hospital.  2. Mood. We continued Wellbutrin for depression.   3. Anxiety. We will give 25 mg of Luvox only for OCD symptoms. Will give only am dose of Minipress for PTSD.  4. Nausea and vomiting. She was given Phenergan.   5. Disposition. To be established.   Kristine LineaJolanta  Antoni Stefan, MD 07/17/2016, 3:51 PM

## 2016-07-17 NOTE — Progress Notes (Signed)
Pt wakes up c/o inability to fall back asleep "for the past half hour." Pt reports that she woke up anxious and vomited. Pt reports that it is not unusual for her to vomit when feeling anxious. Pt flushed vomit down the toilet before RN could assess. Pt reports that her anxiety has decreased at this time and she is no longer feeling nauseous. She reports that when anxious she "does some deep breathing." Writer discusses other ways to relieve stress. Pt verbalizes understanding. Pt remains free from harm. Will continue to monitor.

## 2016-07-17 NOTE — Plan of Care (Signed)
Problem: Coping: Goal: Ability to verbalize feelings will improve Outcome: Not Progressing Patient not forthcoming with information.  Replies "I don't know when asked questions about plans for discharge, how going to deal with stressors and how going to improve depression"

## 2016-07-17 NOTE — Progress Notes (Signed)
Patient remained in bed sleeping majority of day due to fact that did not feel well.  Refused to eat breakfast or lunch because felt as though it would cause her to vomit.  Once up stated that she felt much better.  Smiles readily although affect is flat.  No group attendance.  Refused am meds due to feeling nauseated.  Safety maintained.

## 2016-07-17 NOTE — [facility] Group Notes (Signed)
[facility] Group Notes:  (Nursing/MHT/Case Management/Adjunct)  Date:  07/17/2016  Time:  5:52 AM  Type of Therapy:  Psychoeducational Skills  Participation Level:  Active  Participation Quality:  Appropriate, Attentive, Sharing and Supportive  Affect:  Appropriate  Cognitive:  Appropriate  Insight:  Appropriate and Good  Engagement in Group:  Engaged and Supportive  Modes of Intervention:  Discussion, Socialization and Support  Summary of Progress/Problems:  Chancy MilroyLaquanda Y Mayfield Schoene 07/17/2016, 5:52 AM

## 2016-07-17 NOTE — [facility] Group Notes (Signed)
[facility] Group Notes:  (Nursing/MHT/Case Management/Adjunct)  Date:  07/17/2016  Time:  10:59 PM  Type of Therapy:  Evening Wrap-up Group  Participation Level:  Active  Participation Quality:  Appropriate and Attentive  Affect:  Appropriate  Cognitive:  Alert and Appropriate  Insight:  Appropriate and Good  Engagement in Group:  Engaged  Modes of Intervention:  Activity and Discussion  Summary of Progress/Problems:  Tomasita MorrowChelsea Nanta Jerol Rufener 07/17/2016, 10:59 PM

## 2016-07-18 MED ORDER — TRAZODONE HCL 100 MG PO TABS
100.0000 mg | ORAL_TABLET | Freq: Every day | ORAL | Status: DC
  Administered 2016-07-19: 100 mg via ORAL
  Filled 2016-07-18 (×3): qty 1

## 2016-07-18 MED ORDER — PRAZOSIN HCL 2 MG PO CAPS
2.0000 mg | ORAL_CAPSULE | Freq: Two times a day (BID) | ORAL | Status: DC
  Administered 2016-07-18 – 2016-07-22 (×9): 2 mg via ORAL
  Filled 2016-07-18 (×10): qty 1

## 2016-07-18 NOTE — Plan of Care (Signed)
Problem: Coping: Goal: Ability to verbalize feelings will improve Outcome: Not Progressing States only, "I feel better"  Will not elaborate on why she is here.

## 2016-07-18 NOTE — Progress Notes (Signed)
[facility] MD Progress Note  07/18/2016 1:56 PM MAANVI LECOMPTE  MRN:  [id number]  Subjective:  Ms. Pagliarulo is neatly is unable to tolerate Luvox but responded well to Minipress. We will increase it to twice daily. She still feels depressed, frightened, and suicidal. She still refuses to contact her parents. She did not call any halfway houses as of yet. Halfway The patient has absolutely no place to go. She also does not have enough resources to cover her co-pay for medical services.  Principal Problem: Major depressive disorder, recurrent severe without psychotic features (HCC) Diagnosis:   Patient Active Problem List   Diagnosis Date Noted  . PTSD (post-traumatic stress disorder) [F43.10] 07/15/2016  . OCD (obsessive compulsive disorder) [F42.9] 07/15/2016  . Suicidal ideation [R45.851] 07/15/2016  . Major depressive disorder, recurrent severe without psychotic features (HCC) [F33.2] 07/14/2016   Total Time spent with patient: 20 minutes  Past Psychiatric History: Depression, anxiety.  Past Medical History:  Past Medical History:  Diagnosis Date  . ODD (oppositional defiant disorder)    History reviewed. No pertinent surgical history. Family History: History reviewed. No pertinent family history. Family Psychiatric  History: See H&P. Social History:  History  Alcohol Use  . Yes    Comment: ocassionally     History  Drug Use No    Social History   Social History  . Marital status: Single    Spouse name: N/A  . Number of children: N/A  . Years of education: N/A   Social History Main Topics  . Smoking status: Never Smoker  . Smokeless tobacco: Never Used  . Alcohol use Yes     Comment: ocassionally  . Drug use: No  . Sexual activity: Not Asked   Other Topics Concern  . None   Social History Narrative  . None   Additional Social History:                         Sleep: Fair  Appetite:  Fair  Current Medications: Current Facility-Administered Medications   Medication Dose Route Frequency Provider Last Rate Last Dose  . acetaminophen (TYLENOL) tablet 650 mg  650 mg Oral Q6H PRN Jimmy Footman, MD      . alum & mag hydroxide-simeth (MAALOX/MYLANTA) 200-200-20 MG/5ML suspension 30 mL  30 mL Oral Q4H PRN Jimmy Footman, MD      . buPROPion (WELLBUTRIN XL) 24 hr tablet 150 mg  150 mg Oral Daily Shari Prows, MD   150 mg at 07/18/16 0859  . fluvoxaMINE (LUVOX) tablet 25 mg  25 mg Oral QHS Shari Prows, MD   25 mg at 07/17/16 2120  . loratadine (CLARITIN) tablet 10 mg  10 mg Oral Daily Jimmy Footman, MD   10 mg at 07/18/16 0900  . magnesium hydroxide (MILK OF MAGNESIA) suspension 30 mL  30 mL Oral Daily PRN Jimmy Footman, MD      . prazosin (MINIPRESS) capsule 2 mg  2 mg Oral Q breakfast Jeramiah Mccaughey B Ahmani Prehn, MD   2 mg at 07/18/16 0900  . promethazine (PHENERGAN) tablet 12.5 mg  12.5 mg Oral Q6H PRN Jimmy Footman, MD      . traZODone (DESYREL) tablet 50 mg  50 mg Oral QHS PRN Jimmy Footman, MD        Lab Results:  No results found for this or any previous visit (from the past 48 hour(s)).  Blood Alcohol level:  Lab Results  Component Value Date  ETH <5 07/13/2016   ETH <11 03/01/2013    Metabolic Disorder Labs: Lab Results  Component Value Date   HGBA1C 5.1 03/03/2013   MPG 100 03/03/2013   Lab Results  Component Value Date   PROLACTIN 14.2 03/03/2013   Lab Results  Component Value Date   CHOL 188 (H) 03/03/2013   TRIG 34 03/03/2013   HDL 63 03/03/2013   CHOLHDL 3.0 03/03/2013   VLDL 7 03/03/2013   LDLCALC 118 (H) 03/03/2013    Physical Findings: AIMS:  , ,  ,  ,    CIWA:    COWS:     Musculoskeletal: Strength & Muscle Tone: within normal limits Gait & Station: normal Patient leans: N/A  Psychiatric Specialty Exam: Physical Exam  Nursing note and vitals reviewed.   Review of Systems  Gastrointestinal: Positive for nausea and  vomiting.  Psychiatric/Behavioral: Positive for depression and suicidal ideas.  All other systems reviewed and are negative.   Blood pressure 121/79, pulse 70, temperature 98.6 F (37 C), temperature source Oral, resp. rate 20, height 5\' 5"  (1.651 m), weight 63.5 kg (140 lb), last menstrual period 07/04/2016, SpO2 99 %.Body mass index is 23.3 kg/m.  General Appearance: Casual  Eye Contact:  Good  Speech:  Clear and Coherent  Volume:  Normal  Mood:  Anxious, Depressed and Hopeless  Affect:  Appropriate  Thought Process:  Goal Directed  Orientation:  Full (Time, Place, and Person)  Thought Content:  WDL  Suicidal Thoughts:  Yes.  with intent/plan  Homicidal Thoughts:  No  Memory:  Immediate;   Fair Recent;   Fair Remote;   Fair  Judgement:  Impaired  Insight:  Shallow  Psychomotor Activity:  Normal  Concentration:  Concentration: Fair and Attention Span: Fair  Recall:  [employer]Fair  Fund of Knowledge:  Fair  Language:  Fair  Akathisia:  No  Handed:  Right  AIMS (if indicated):     Assets:  Communication Skills Desire for Improvement Financial Resources/Insurance Physical Health Resilience Social Support  ADL's:  Intact  Cognition:  WNL  Sleep:  Number of Hours: 7.25     Treatment Plan Summary: Daily contact with patient to assess and evaluate symptoms and progress in treatment and Medication management   [name] is an 20 year old female with a history of depression and ODD admitted for suicidal ideation in the context of social stressors.   1. Suicidal ideation. The patient is able to contract for safety in the hospital.  2. Mood. We continued Wellbutrin for depression.   3. Anxiety. We discontinue Luvox. We will increase Minipress to bid for nightmares and flashbacks as she seems to tolerate it well.   4. Nausea and vomiting. Resolved.    5. Disposition. To be established.   Kristine LineaJolanta Kyrus Hyde, MD 07/18/2016, 1:56 PM

## 2016-07-18 NOTE — [facility] Group Notes (Signed)
[facility] LCSW Group Therapy  07/18/2016 12:40 PM  Type of Therapy:  Group Therapy  Participation Level:  Minimal  Participation Quality:  Attentive  Affect:  Appropriate  Cognitive:  Alert  Insight:  Limited  Engagement in Therapy:  Limited  Modes of Intervention:  Activity, Discussion, Education and Support  Summary of Progress/Problems: Balance in life: Patients will discuss the concept of balance and how it looks and feels to be unbalanced. Pt will identify areas in their life that is unbalanced and ways to become more balanced. Pt stated spending time with family is important to her. Pt states communicating with her family helps keep her balanced.     Marvis Bakken G. Garnette CzechSampson MSW, LCSWA 07/18/2016, 12:41 PM

## 2016-07-18 NOTE — [facility] Group Notes (Signed)
[facility] LCSW Group Therapy Note  Type of Therapy and Topic:  Group Therapy:  Goals Group: SMART Goals  Participation Level:  Patient attended group on this date and minimally participated in the group discussion.   Description of Group:   The purpose of a daily goals group is to assist and guide patients in setting recovery/wellness-related goals.  The objective is to set goals as they relate to the crisis in which they were admitted. Patients will be using SMART goal modalities to set measurable goals.  Characteristics of realistic goals will be discussed and patients will be assisted in setting and processing how one will reach their goal. Facilitator will also assist patients in applying interventions and coping skills learned in psycho-education groups to the SMART goal and process how one will achieve defined goal.  Therapeutic Goals: -Patients will develop and document one goal related to or their crisis in which brought them into treatment. -Patients will be guided by LCSW using SMART goal setting modality in how to set a measurable, attainable, realistic and time sensitive goal.  -Patients will process barriers in reaching goal. -Patients will process interventions in how to overcome and successful in reaching goal.   Summary of Patient Progress:  Patient Goal: Patient stated "I don't have a goal for myself". CSW provided support to patient and encouraged her to think about that more before she discharges. Patient was unable to identify a goal for herself during group.    Therapeutic Modalities:   Motivational Interviewing Engineer, manufacturing systemsCognitive Behavioral Therapy Crisis Intervention Model SMART goals setting  Brendia Dampier G. Garnette CzechSampson MSW, [facility] NortheastCSWA 07/18/2016 12:39 PM

## 2016-07-18 NOTE — Progress Notes (Signed)
Affect brighter today.  Denies SI/HI/AVH.  Visible in the milieu.  Interacting with peers and staff appropriately.  Good appetite. Poor hygiene.  Body odor noted.  Support and encouragement offered.  Safety maintained.

## 2016-07-18 NOTE — Plan of Care (Signed)
Problem: Self-Concept: Goal: Level of anxiety will decrease Outcome: Progressing Pt appeared less anxious this shift.

## 2016-07-18 NOTE — Progress Notes (Signed)
D: Observed pt in dayroom interacting with peers. Patient alert and oriented x4. Patient denies SI/HI/AVH. Pt affect is depressed. Pt stated her day was "good." Pt attending groups, but when asked about what she's learned pt stated "they don't really help." Pt less anxious this evening, and stated that she is feeling better. Pt denied n/v.  A: Offered active listening and support. Provided therapeutic communication. Administered scheduled medications.  R: Pt pleasant and cooperative. Pt medication compliant. Will continue Q15 min. checks. Safety maintained.

## 2016-07-18 NOTE — [facility] Group Notes (Addendum)
[facility] Group Notes:  (Nursing/MHT/Case Management/Adjunct)  Date:  07/18/2016  Time:  5:58 PM  Type of Therapy:  Psychoeducational Skills  Participation Level:  Active  Participation Quality:  Appropriate, Sharing and Supportive  Affect:  Appropriate  Cognitive:  Appropriate  Insight:  Appropriate  Engagement in Group:  Engaged and Supportive  Modes of Intervention:  Discussion, Education and Support  Summary of Progress/Problems:  Noelle PennerKristen J Kasch Borquez  07/18/2016, 5:58 PM

## 2016-07-19 MED ORDER — TRAZODONE HCL 100 MG PO TABS: 100.0000 mg | ORAL_TABLET | Freq: Every day | ORAL | 1 refills | Status: DC

## 2016-07-19 MED ORDER — BUPROPION HCL ER (XL) 150 MG PO TB24: 150.0000 mg | ORAL_TABLET | Freq: Every day | ORAL | 1 refills | Status: DC

## 2016-07-19 MED ORDER — BUPROPION HCL ER (XL) 150 MG PO TB24
150.0000 mg | ORAL_TABLET | Freq: Every day | ORAL | 1 refills | Status: DC
Start: 2016-07-19 — End: 2016-07-23

## 2016-07-19 MED ORDER — PRAZOSIN HCL 2 MG PO CAPS: 2.0000 mg | ORAL_CAPSULE | Freq: Two times a day (BID) | ORAL | 1 refills | Status: DC

## 2016-07-19 NOTE — Progress Notes (Signed)
Patient rated her depression 7/10 and anxiety 5/10.Denies suicidal or homicidal ideations.Pleasant & cooperative on approach.Appropriate with staff & peers.Compliant with medications.Attended groups.Support & encouragement given.Safety maintained.
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
## 2016-07-19 NOTE — Plan of Care (Signed)
Problem: Health Behavior/Discharge Planning: Goal: Compliance with treatment plan for underlying cause of condition will improve Outcome: Progressing Pt attends groups on the unit and takes medications as prescribed.  Problem: Coping: Goal: Ability to cope will improve Outcome: Progressing Pt discusses with writer ways to "decompress" including doing puzzles and reading.

## 2016-07-19 NOTE — [facility] Group Notes (Signed)
[facility] Group Notes:  (Nursing/MHT/Case Management/Adjunct)  Date:  07/19/2016  Time:  6:10 PM  Type of Therapy:  Psychoeducational Skills  Participation Level:  Active  Participation Quality:  Appropriate, Attentive and Sharing  Affect:  Appropriate  Cognitive:  Appropriate  Insight:  Appropriate  Engagement in Group:  Engaged  Modes of Intervention:  Socialization  Summary of Progress/Problems:  Christina SmokeCara Travis Adison Case 07/19/2016, 6:10 PM

## 2016-07-19 NOTE — [facility] Group Notes (Signed)
[facility] Group Notes:  (Nursing/MHT/Case Management/Adjunct)  Date:  07/19/2016  Time:  2:24 AM  Type of Therapy:  Group Therapy  Participation Level:  Active  Participation Quality:  Appropriate  Affect:  Appropriate  Cognitive:  Appropriate  Insight:  Appropriate  Engagement in Group:  Engaged  Modes of Intervention:  Discussion  Summary of Progress/Problems: Pt stated that today had been a rough day for her because her mom had found out where she was. Pt stated that she is not prepared to deal with her plus try to find living arrangements. Staff made pt aware that her social worker could help her with placement if needed.   Christina Case 07/19/2016, 2:24 AM

## 2016-07-19 NOTE — Progress Notes (Signed)
D: Pt is seen in the milieu interacting appropriately with staff and peers this evening. She rates depression 8/10 and anxiety 5/10. Denies SI/HI/AVH at this time. Pt reports she did not have a goal today. Unable to identify what she has learned in groups. Pt reports that she is upset that her mom now has her passcode. Body odor noted. A: Emotional support and encouragement provided. Positive coping skills discussed. q15 minute safety checks maintained.  R: Pt remains free from harm. Will continue to monitor.

## 2016-07-19 NOTE — [facility] Group Notes (Signed)
[facility] LCSW Group Therapy  07/19/2016 2:03 PM  Type of Therapy:  Group Therapy  Participation Level:  Minimal  Participation Quality:  Attentive  Affect:  Appropriate  Cognitive:  Alert  Insight:  Improving  Engagement in Therapy:  Improving  Modes of Intervention:  Activity, Discussion, Education and Support  Summary of Progress/Problems:Feelings around Relapse. Group members discussed the meaning of relapse and shared personal stories of relapse, how it affected them and others, and how they perceived themselves during this time. Group members were encouraged to identify triggers, warning signs and coping skills used when facing the possibility of relapse. Social supports were discussed and explored in detail. Patients also discussed facing disappointment and how that can trigger someone to relapse. Patient shared that her triggers are her parents. When asked to explain, patient stated she feels her parents have never validated her for some of her childhood experiences. CSW provided support and encouragement to patient for sharing openly with the group.    Tyge Somers G. Garnette CzechSampson MSW, LCSWA 07/19/2016, 2:06 PM

## 2016-07-20 NOTE — [facility] Group Notes (Signed)
[facility] Group Notes:  (Nursing/MHT/Case Management/Adjunct)  Date:  07/20/2016  Time:  5:43 AM  Type of Therapy:  Psychoeducational Skills  Participation Level:  Active  Participation Quality:  Appropriate, Sharing and Supportive  Affect:  Appropriate  Cognitive:  Appropriate  Insight:  Appropriate and Good  Engagement in Group:  Engaged and Supportive  Modes of Intervention:  Discussion, Socialization and Support  Summary of Progress/Problems:  Chancy MilroyLaquanda Y Melisa Donofrio 07/20/2016, 5:43 AM

## 2016-07-20 NOTE — Progress Notes (Signed)
Patient stated that trazodone makes her sleepy in the afternoon.Denies suicidal or homicidal ideations.Pleasant and cooperative in the unit.Attended groups.Compliant with medications.Safety maintained.
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
## 2016-07-20 NOTE — Progress Notes (Signed)
D: Pt denies SI/HI/AVH. Pt is pleasant and cooperative. Pt appeared less anxious and he is interacting with peers and staff appropriately.  A: Pt was offered support and encouragement. Pt was given scheduled medications. Pt was encouraged to attend groups. Q 15 minute checks were done for safety.  R:Pt attends groups and interacts well with peers and staff. Pt is taking medication. Pt has no complaints.Pt receptive to treatment and safety maintained on unit.

## 2016-07-20 NOTE — [facility] Group Notes (Signed)
[facility] LCSW Group Therapy  07/20/2016 4:41 PM  Type of Therapy:  Group Therapy  Participation Level:  Patient did not attend group. CSW invited patient to group.   Summary of Progress/Problems:Protective Factors: Patients defined protective factors and discussed the importance recognizing their personal strengths. Patients identified their own protective factors and resiliency. Patients discussed building upon those protective factors to improve their ability to cope with life challenges.    Christina Case G. Christina Case MSW, LCSWA 07/20/2016, 4:41 PM  

## 2016-07-21 MED ORDER — TRAZODONE HCL 50 MG PO TABS
50.0000 mg | ORAL_TABLET | Freq: Every evening | ORAL | Status: DC | PRN
  Administered 2016-07-22: 50 mg via ORAL
  Filled 2016-07-21: qty 1

## 2016-07-21 NOTE — Progress Notes (Signed)
D: Patient is alert and oriented on the unit this shift. Patient attended and actively participated in groups today. Patient denies suicidal ideation, homicidal ideation, auditory or visual hallucinations at the present time.  A: Scheduled medications are administered to patient as per MD orders. Emotional support and encouragement are provided. Patient is maintained on q.15 minute safety checks. Patient is informed to notify staff with questions or concerns. R: No adverse medication reactions are noted. Patient is cooperative with medication administration and treatment plan today. Patient is receptive, anxious and cooperative on the unit at this time. Patient interacts well with others on the unit this shift. Patient contracts for safety at this time. Patient remains safe at this time.  

## 2016-07-21 NOTE — Plan of Care (Signed)
Problem: Coping: Goal: Ability to identify and develop effective coping behavior will improve Outcome: Progressing Patient mentions having quiet time to herself as a coping skill.

## 2016-07-21 NOTE — Plan of Care (Signed)
Problem: Coping: Goal: Ability to verbalize feelings will improve Outcome: Progressing Patient able to verbalize feelings on the unit at this time [facility]

## 2016-07-21 NOTE — [facility] Group Notes (Signed)
[facility] Group Notes:  (Nursing/MHT/Case Management/Adjunct)  Date:  07/21/2016  Time:  4:02 AM  Type of Therapy:  Psychoeducational Skills  Participation Level:  Active  Participation Quality:  Appropriate, Attentive, Sharing and Supportive  Affect:  Appropriate  Cognitive:  Appropriate  Insight:  Appropriate and Good  Engagement in Group:  Engaged and Supportive  Modes of Intervention:  Discussion, Socialization and Support  Summary of Progress/Problems:  Christina MilroyLaquanda Y Darcy Case 07/21/2016, 4:02 AM

## 2016-07-21 NOTE — [facility] Group Notes (Signed)
[facility] LCSW Group Therapy  07/21/2016 2:49 PM  Type of Therapy:  Group Therapy  Participation Level:  Patient did not attend group. CSW invited patient to group.   Summary of Progress/Problems:Coping Skills: Patients defined and discussed healthy coping skills. Patients identified healthy coping skills they would like to try during hospitalization and after discharge. CSW offered insight to varying coping skills that may have been new to patients such as practicing mindfulness.  Cristal Qadir G. Garnette CzechSampson MSW, LCSWA 07/21/2016, 2:49 PM
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
## 2016-07-21 NOTE — Progress Notes (Signed)
Patient cooperative with ward routine and attended group without any aggressive/in appropriate behaviors being noted. She denied SI/AVH and contracted for safety. Medication education done. Patient verbalized understanding.

## 2016-07-21 NOTE — Plan of Care (Signed)
Problem: Coping: Goal: Ability to cope will improve Outcome: Not Progressing Pt not able to cope due to no coping skills [place] RN

## 2016-07-22 MED ORDER — BUPROPION HCL ER (XL) 150 MG PO TB24
300.0000 mg | ORAL_TABLET | Freq: Every day | ORAL | Status: DC
  Filled 2016-07-22: qty 2

## 2016-07-22 MED ORDER — TEMAZEPAM 15 MG PO CAPS
15.0000 mg | ORAL_CAPSULE | Freq: Once | ORAL | Status: AC
  Administered 2016-07-22: 15 mg via ORAL
  Filled 2016-07-22: qty 1

## 2016-07-22 NOTE — Plan of Care (Signed)
Problem: Health Behavior/Discharge Planning: Goal: Identification of resources available to assist in meeting health care needs will improve Outcome: Progressing Goal; patient will be aware of triggers for anxiety and be able to identity coping skills as well know resources that are available to assist in meeting health care needs.

## 2016-07-22 NOTE — [facility] Group Notes (Signed)
[facility] Group Notes:  (Nursing/MHT/Case Management/Adjunct)  Date:  07/22/2016  Time:  9:56 PM  Type of Therapy:  Evening Wrap-up Group  Participation Level:  Active  Participation Quality:  Appropriate and Attentive  Affect:  Appropriate  Cognitive:  Alert and Appropriate  Insight:  Appropriate and Good  Engagement in Group:  Engaged  Modes of Intervention:  Activity  Summary of Progress/Problems:  Christina Case Christina Case 07/22/2016, 9:56 PM 
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
## 2016-07-22 NOTE — [facility] Group Notes (Signed)
[facility]  07/22/2016 3:39 PM  Participation Level:  Active   Description of Group:    In this group patients will be encouraged to explore what they see as obstacles to their own wellness and recovery. They will be guided to discuss their thoughts, feelings, and behaviors related to these obstacles. The group will process together ways to cope with barriers, with attention given to specific choices patients can make. Each patient will be challenged to identify changes they are motivated to make in order to overcome their obstacles. This group will be process-oriented, with patients participating in exploration of their own experiences as well as giving and receiving support and challenge from other group members.  Therapeutic Goals: 1. Patient will identify personal and current obstacles as they relate to admission. 2. Patient will identify barriers that currently interfere with their wellness or overcoming obstacles.  3. Patient will identify feelings, thought process and behaviors related to these barriers. 4. Patient will identify two changes they are willing to make to overcome these obstacles:    Summary of Patient Progress Pt  able to meet therapeutic goals.  Group discussed qualities of resilience or persistence that could benefit recovery efforts and overcoming the obstacles faced.  Pt plans to change living situation and continue medications and follow up as part of overcoming her obstacles.  Therapeutic Modalities:   Cognitive Behavioral Therapy Solution Focused Therapy Motivational Interviewing Relapse Prevention Therapy  Christina Case,MSW, LCSW 07/22/2016, 3:39 PM

## 2016-07-22 NOTE — Progress Notes (Signed)
Patient ID: Christina Case, female   DOB: [birth year], 20 y.o.   MRN: [id number] Pt has been offered [facility] [place] on Thursday 07/25/2016 at 10:00pm. She plans to attend and verbalizes that if discharged prior to that appointment she could likely stay with friends until appointment.  Larene BeachJessice Witmer [phone] is contact at the home.  Pt's mother called as well today.  CSW directed  Her to speak with Pt directly if she would like more information on her plan and current status as CSW unable to provide additional info.   Jake SharkSara Jordany Russett, MSW, LCSW 07/22/2016

## 2016-07-22 NOTE — [facility] Group Notes (Signed)
[facility] Group Notes:  (Nursing/MHT/Case Management/Adjunct)  Date:  07/22/2016  Time:  5:34 AM  Type of Therapy:  Psychoeducational Skills  Participation Level:  Active  Participation Quality:  Appropriate, Attentive, Sharing and Supportive  Affect:  Appropriate  Cognitive:  Appropriate  Insight:  Appropriate and Good  Engagement in Group:  Engaged and Supportive  Modes of Intervention:  Discussion, Socialization and Support  Summary of Progress/Problems:  Christina MilroyLaquanda Y My Case 07/22/2016, 5:34 AM

## 2016-07-22 NOTE — Progress Notes (Signed)
Patient is oriented, mood is good, smiles and is compliant with medications, interacts well with other Patient's as well as staff. Patient has been worried about b/p with the new medication minipress, b/p was decreased when taken before she has gotten up this am, and nurse told her it was from lying and sleeping all night, but nurse did retake v/s for Patient today and b/p was within normal limits when sitting and standing. Nurse did let Patient to know the symptoms to be aware of if b/p did become to low such as dizziness, headache or faint feeling. Patient voices understanding. Patient does deny Si/Hi or avh. q 15 min. Checks in progress.

## 2016-07-23 MED ORDER — BUPROPION HCL ER (XL) 300 MG PO TB24: 300.0000 mg | ORAL_TABLET | Freq: Every day | ORAL | 1 refills | Status: DC

## 2016-07-23 NOTE — Progress Notes (Signed)
  [facility] Discharge Plan :  Will you be returning to the same living situation after discharge:  No.Ms. Estell refuses to return home to her parents, is [facility] as she was unable to make other arrangements until her interview with the [facility] on 9/8 at 10pm. At discharge, do you have transportation home?: Yes,   CSW provided [facility] Do you have the ability to pay for your medications: Yes,   Pt has BSBS insurance but states copay may be a problem, provided with 7 day supply.  Release of information consent forms completed and in the chart;  Patient's signature needed at discharge.  Patient to Follow up at: Follow-up Information    [facility]. Go on 07/29/2016.   Why:  See Mauri Poleara Trusdale for Counseling.  Call if any problem making appointment to reschedule. Contact information: [address] [place], [id number] [phone] FAX        [facility]. Go on 07/30/2016.   Why:  See Dr. Mila HomerSena for Medication Managment.  Please call if unable to make appointment to reschedule. Contact information: [address] [place], [id number] [phone] FAX           Next level of care provider has access to [facility] Link:no  Safety Planning and Suicide Prevention discussed: Yes,     Have you used any form of tobacco in the last 30 days? (Cigarettes, Smokeless Tobacco, Cigars, and/or Pipes): No  Has patient been referred to the Quitline?: N/A patient is not a smoker  Patient has been referred for addiction treatment: Yes  Glennon MacSara P Itzayana Pardy, MSW, LCSW 07/23/2016, 3:38 PM

## 2016-07-23 NOTE — BHH Suicide Risk Assessment (Signed)
[facility] INPATIENT:  Family/Significant Other Suicide Prevention Education  Suicide Prevention Education:  Patient Refusal for Family/Significant Other Suicide Prevention Education: The patient Christina Case has refused to provide written consent for family/significant other to be provided Family/Significant Other Suicide Prevention Education during admission and/or prior to discharge.  Physician notified.  Glennon MacSara P Nissim Fleischer, MSW, LCSW 07/23/2016, 3:36 PM

## 2016-07-23 NOTE — Tx Team (Signed)
Interdisciplinary Treatment and Diagnostic Plan Update  07/23/2016 Time of Session: 2:00pm Christina Case MRN: [id number]  Principal Diagnosis: Major depressive disorder, recurrent severe without psychotic features (HCC)  Secondary Diagnoses: Principal Problem:   Major depressive disorder, recurrent severe without psychotic features (HCC) Active Problems:   PTSD (post-traumatic stress disorder)   OCD (obsessive compulsive disorder)   Suicidal ideation   Current Medications:  Current Facility-Administered Medications  Medication Dose Route Frequency Provider Last Rate Last Dose  . acetaminophen (TYLENOL) tablet 650 mg  650 mg Oral Q6H PRN Jimmy FootmanAndrea Hernandez-Gonzalez, MD      . alum & mag hydroxide-simeth (MAALOX/MYLANTA) 200-200-20 MG/5ML suspension 30 mL  30 mL Oral Q4H PRN Jimmy FootmanAndrea Hernandez-Gonzalez, MD      . buPROPion (WELLBUTRIN XL) 24 hr tablet 300 mg  300 mg Oral Daily Audery AmelJohn T Clapacs, MD      . loratadine (CLARITIN) tablet 10 mg  10 mg Oral Daily Jimmy FootmanAndrea Hernandez-Gonzalez, MD   10 mg at 07/22/16 [id number]  . magnesium hydroxide (MILK OF MAGNESIA) suspension 30 mL  30 mL Oral Daily PRN Jimmy FootmanAndrea Hernandez-Gonzalez, MD      . prazosin (MINIPRESS) capsule 2 mg  2 mg Oral BID Shari ProwsJolanta B Pucilowska, MD   2 mg at 07/22/16 1708  . traZODone (DESYREL) tablet 50 mg  50 mg Oral QHS PRN Darliss RidgelAarti K Kapur, MD   50 mg at 07/22/16 2129   PTA Medications: Prescriptions Prior to Admission  Medication Sig Dispense Refill Last Dose  . buPROPion (WELLBUTRIN XL) 300 MG 24 hr tablet Take 1 tablet (300 mg total) by mouth daily. 30 tablet 0   . diclofenac (VOLTAREN) 75 MG EC tablet Take 75 mg by mouth 2 (two) times daily.   2 weeks  . diclofenac (VOLTAREN) 75 MG EC tablet Take 1 tablet (75 mg total) by mouth 2 (two) times daily. Patient may resume home supply.     . loratadine (CLARITIN) 10 MG tablet Take 10 mg by mouth daily.   02/27/13  . loratadine (CLARITIN) 10 MG tablet Take 1 tablet (10 mg total) by mouth daily.  Patient may resume home supply.       Treatment Modalities: Medication Management, Group therapy, Case management,  1 to 1 session with clinician, Psychoeducation, Recreational therapy.   Physician Treatment Plan for Primary Diagnosis: Major depressive disorder, recurrent severe without psychotic features (HCC) Long Term Goal(s): Improvement in symptoms so as ready for discharge  Short Term Goals: Ability to identify changes in lifestyle to reduce recurrence of condition will improve, Ability to verbalize feelings will improve, Ability to disclose and discuss suicidal ideas, Ability to demonstrate self-control will improve, Ability to identify and develop effective coping behaviors will improve and Ability to identify triggers associated with substance abuse/mental health issues will improve  Medication Management: Evaluate patient's response, side effects, and tolerance of medication regimen.  Therapeutic Interventions: 1 to 1 sessions, Unit Group sessions and Medication administration.  Evaluation of Outcomes: Adequate for discharge  Physician Treatment Plan for Secondary Diagnosis: Principal Problem:   Major depressive disorder, recurrent severe without psychotic features (HCC) Active Problems:   PTSD (post-traumatic stress disorder)   OCD (obsessive compulsive disorder)   Suicidal ideation   Long Term Goal(s): Improvement in symptoms so as ready for discharge  Short Term Goals: Ability to identify changes in lifestyle to reduce recurrence of condition will improve, Ability to verbalize feelings will improve, Ability to demonstrate self-control will improve, Compliance with prescribed medications will improve and Ability to identify  triggers associated with substance abuse/mental health issues will improve  Medication Management: Evaluate patient's response, side effects, and tolerance of medication regimen.  Therapeutic Interventions: 1 to 1 sessions, Unit Group  sessions and Medication administration.  Evaluation of Outcomes: Adequate for discharge   RN Treatment Plan for Primary Diagnosis: Major depressive disorder, recurrent severe without psychotic features (HCC) Long Term Goal(s): Knowledge of disease and therapeutic regimen to maintain health will improve  Short Term Goals: Ability to participate in decision making will improve, Ability to verbalize feelings will improve, Ability to identify and develop effective coping behaviors will improve and Compliance with prescribed medications will improve  Medication Management: RN will administer medications as ordered by provider, will assess and evaluate patient's response and provide education to patient for prescribed medication. RN will report any adverse and/or side effects to prescribing provider.  Therapeutic Interventions: 1 on 1 counseling sessions, Psychoeducation, Medication administration, Evaluate responses to treatment, Monitor vital signs and CBGs as ordered, Perform/monitor CIWA, COWS, AIMS and Fall Risk screenings as ordered, Perform wound care treatments as ordered.  Evaluation of Outcomes: Adequate for discharge   LCSW Treatment Plan for Primary Diagnosis: Major depressive disorder, recurrent severe without psychotic features (HCC) Long Term Goal(s): Safe transition to appropriate next level of care at discharge, Engage patient in therapeutic group addressing interpersonal concerns.  Short Term Goals: Engage patient in aftercare planning with referrals and resources, Increase social support, Increase ability to appropriately verbalize feelings, Increase emotional regulation, Facilitate acceptance of mental health diagnosis and concerns, Facilitate patient progression through stages of change regarding substance use diagnoses and concerns, Identify triggers associated with mental health/substance abuse issues and Increase skills for wellness and recovery  Therapeutic  Interventions: Assess for all discharge needs, 1 to 1 time with Social worker, Explore available resources and support systems, Assess for adequacy in community support network, Educate family and significant other(s) on suicide prevention, Complete Psychosocial Assessment, Interpersonal group therapy.  Evaluation of Outcomes:Adequate for discharge   Progress in Treatment: Attending groups: No Participating in groups: No Taking medication as prescribed: Yes, MD continues to assess for medication changes as needed Toleration medication: Yes, no side effects reported at this time Family/Significant other contact made: CSW, still assessing for appropriate contacts Pt refuses contact with mother. Patient understands diagnosis: Yes Discussing patient identified problems/goals with staff: Yes Medical problems stabilized or resolved: Yes Denies suicidal/homicidal ideation: Yes Issues/concerns per patient self-inventory: None Other: N/A  New problem(s) identified: None identified at this time.   New Short Term/Long Term Goal(s): None identified at this time.   Discharge Plan or Barriers: Referred to [facility] in [place], has interview with [facility] on 07/26/16 at 10 pm,   Reason for Continuation of Hospitalization: Anxiety Delusions  Depression Hallucinations Homicidal ideation Mania Medical Issues  Suicidal ideation Withdrawal symptoms  Date of discharge: 0 days  Attendees: Patient: Christina Case 07/23/2016 2:00pm  Physician: Dr. Jennet Maduro, MD 07/23/2016 2:00pm  Nursing:Gwen Chrisandra Carota, RN 07/23/2016 2:00pm  RN Care Manager: 07/23/2016 2:00pm  Social Worker:Kregg Cihlar, LCSW   07/23/2016 2:00pm  Recreational Therapist:           Social Worker:    Other:    Other:    Scribe for Treatment Team: Glennon Mac, LCSW 07/23/2016 5:22 PM

## 2016-07-23 NOTE — Discharge Summary (Signed)
Physician Discharge Summary Note  Patient:  Christina BrashCaitlin M Case is an 20 y.o., female MRN:  [id number] DOB:  09-Oct-1996 Patient phone:  [phone] (home)  Patient address:   [address] [place] [address],  Total Time spent with patient: 30 minutes  Date of Admission:  07/14/2016 Date of Discharge: 07/23/2016  Reason for Admission:  Suicidal ideation.  Identifying data. Christina Case is an 20 year old female with history of depression and anxiety.  Chief complaint. "My parents kicked me out on Monday."  History of present illness. Information was obtained from the patient and the chart. The patient has a history of depression that begun when in high school. She did not receive any treatment until she was hospitalized in [place] of this [place] after suicide attempt by overdose. She was prescribed that she did not take. It was partly because she dislikes medication but also her follow-up appointment with Dr. Lafayette Dragonarr was scheduled in the fall and the patient did not have enough refills so she decided to stop it. She returns to the hospital complaining of suicidal the past 2 days. She did not attempt to hurt herself this time. Her parents kicked out of the house a week ago for arguing and being disrespectful. There is a long-standing conflict with the parents it was exacerbated by the fact that the patient did not do well at [employer]. Her fall semester last year was close to the disaster. In the spring she did not go to classes at all. As she did [facility] but no medication was recommended. For the past week she has been staying with friends but is not sure if she can return there. She reports many symptoms of depression with decreased appetite and weight loss, anhedonia, feeling of guilt and hopelessness worthlessness, poor energy and concentration, and social isolation that culminated in suicidal thinking with a plan to overdose on medications. She denies insomnia  or crying spells. She denies psychotic symptoms or symptoms suggestive of bipolar mania. She reports nightmares and flashbacks suggestive of PTSD. She is not forthcoming about events leading to her symptoms but told me that he had been in high school. She believes that especially during spring semester she was troubled by PTSD symptoms. She also has periods when she experiences also a type of symptoms. She had some problems with alcohol in high school but no longer drinks or uses drugs. Her urine tox screen was negative for substances. The patient denies that alcohol or substances contributed to her poor performance in school.   Past psychiatric history. [place] [place] and was prescribed Wellbutrin. This was after suicide attempt by caffeine pill overdose. She did not follow-up with a psychiatrist as the closest appointment was in October. She was noncompliant with medications. She attempted therapy but could not afford her co-pay of $25. 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 The patient denies any symptoms and was unaware of her prior diagnoses. She denies substance use but per family report she is a drinker.  Family psychiatric history. Father with alcoholism.   Social history. She graduated from high school. She failed spring semester at [employer]. She no longer is allowed to live with her parents although this is not the first time they kicked her out.The patient reports that works at the [employer] and finds it enjoyable. According to her mother, she has not been  at work for the past  Weeks.  Principal Problem: Major depressive disorder, recurrent severe without psychotic features [facility]) Discharge Diagnoses: Patient Active Problem List   Diagnosis Date Noted  . PTSD (post-traumatic stress disorder) [F43.10] 07/15/2016  . OCD (obsessive compulsive disorder) [F42.9]  07/15/2016  . Suicidal ideation [R45.851] 07/15/2016  . Major depressive disorder, recurrent severe without psychotic features (HCC) [F33.2] 07/14/2016    Past Medical History:  Past Medical History:  Diagnosis Date  . ODD (oppositional defiant disorder)    History reviewed. No pertinent surgical history. Family History: History reviewed. No pertinent family history.  Social History:  History  Alcohol Use  . Yes    Comment: ocassionally     History  Drug Use No    Social History   Social History  . Marital status: Single    Spouse name: N/A  . Number of children: N/A  . Years of education: N/A   Social History Main Topics  . Smoking status: Never Smoker  . Smokeless tobacco: Never Used  . Alcohol use Yes     Comment: ocassionally  . Drug use: No  . Sexual activity: Not Asked   Other Topics Concern  . None   Social History Narrative  . None    Hospital Course:    Ms. Christina Case is an 20 year old female with a history of depression and ODD admitted for suicidal ideation in the context of social stressors.   1. Suicidal ideation.This has resolved.  The patient is able to contract for safety.   2. Mood. We continued Wellbutrin at 300 mg for depression.   3. Anxiety. We started prazosin for nightmares and flashbacks of PTSD.   4. Alcohol abuse. The patient does have a history of abusing Klonopin, Xanax and alcohol. She denies current use. There were no symptoms of alcohol withdrawal. Vital signs were stable. She was advised to abstain from alcohol and drugs as they may worsen mood symptoms.   5. Insomnia. Trazodone was available.  6. Disposition. The patient was discharged to [facility] in [place]. She is welcome to return home with family but she refused. She will follow up with [facility].   Physical Findings: AIMS: Facial and Oral Movements Muscles of Facial Expression: None, normal Lips and Perioral Area: None, normal Jaw: None, normal Tongue: None,  normal,Extremity Movements Upper (arms, wrists, hands, fingers): None, normal Lower (legs, knees, ankles, toes): None, normal, Trunk Movements Neck, shoulders, hips: None, normal, Overall Severity Severity of abnormal movements (highest score from questions above): None, normal Incapacitation due to abnormal movements: None, normal Patient's awareness of abnormal movements (rate only patient's report): No Awareness, Dental Status Current problems with teeth and/or dentures?: No Does patient usually wear dentures?: No  CIWA:    COWS:     Musculoskeletal: Strength & Muscle Tone: within normal limits Gait & Station: normal Patient leans: N/A  Psychiatric Specialty Exam: Physical Exam  Nursing note and vitals reviewed.   Review of Systems  Psychiatric/Behavioral: Positive for substance abuse. The patient is nervous/anxious.   All other systems reviewed and are negative.   Blood pressure 106/73, pulse 98, temperature 98.7 F (37.1 C), temperature source Oral, resp. rate 20, height 5\' 5"  (1.651 m), weight 63.5 kg (140 lb), last menstrual period 07/04/2016, SpO2 100 %.Body mass index is 23.3 kg/m.  See SRA.  Have you used any form of tobacco in the last 30 days? (Cigarettes, Smokeless Tobacco, Cigars, and/or Pipes): No  Has this patient used any form of tobacco in the last 30 days? (Cigarettes, Smokeless Tobacco, Cigars, and/or Pipes) Yes, No  Blood Alcohol level:  Lab Results  Component Value Date   [facility] <5 07/13/2016   ETH <11 03/01/2013    Metabolic Disorder Labs:  Lab Results  Component Value Date   HGBA1C 5.1 03/03/2013   MPG 100 03/03/2013   Lab Results  Component Value Date   PROLACTIN 14.2 03/03/2013   Lab Results  Component Value Date   CHOL 188 (H) 03/03/2013   TRIG 34 03/03/2013   HDL 63 03/03/2013   CHOLHDL 3.0 03/03/2013   VLDL 7 03/03/2013   LDLCALC 118 (H) 03/03/2013     See Psychiatric Specialty Exam and Suicide Risk Assessment completed by Attending Physician prior to discharge.  Discharge destination:  Other:  [facility]  Is patient on multiple antipsychotic therapies at discharge:  No   Has Patient had three or more failed trials of antipsychotic monotherapy by history:  No  Recommended Plan for Multiple Antipsychotic Therapies: NA  Discharge Instructions    Diet - low sodium heart healthy    Complete by:  As directed   Increase activity slowly    Complete by:  As directed       Medication List    STOP taking these medications   diclofenac 75 MG EC tablet Commonly known as:  VOLTAREN   loratadine 10 MG tablet Commonly known as:  CLARITIN     TAKE these medications     Indication  buPROPion 300 MG 24 hr tablet Commonly known as:  WELLBUTRIN XL Take 1 tablet (300 mg total) by mouth daily. What changed:  Another medication with the same name was added. Make sure you understand how and when to take each.  Indication:  Depressive Phase of Manic-Depression   buPROPion 300 MG 24 hr tablet Commonly known as:  WELLBUTRIN XL Take 1 tablet (300 mg total) by mouth daily. What changed:  You were already taking a medication with the same name, and this prescription was added. Make sure you understand how and when to take each.  Indication:  Major Depressive Disorder   prazosin 2 MG capsule Commonly known as:  MINIPRESS Take 1 capsule (2 mg total) by mouth 2 (two) times daily.  Indication:  PTSD   traZODone 100 MG tablet Commonly known as:  DESYREL Take 1 tablet (100 mg total) by mouth at bedtime.  Indication:  Trouble Sleeping      Follow-up Information    [facility] .   Why:  DO NOT SEND Contact information: [address] [place] [address] [phone]           Follow-up recommendations:  Activity:  as tolerated. Diet:  regular. Other:  keep follow up appointment.  Comments:     Signed: Kristine Linea, MD 07/23/2016, 8:08 AM

## 2016-07-23 NOTE — [facility] Group Notes (Signed)
[facility] LCSW Group Therapy   07/23/2016 9:30am  Type of Therapy: Group Therapy   Participation Level: Active   Participation Quality: Attentive, Sharing and Supportive   Affect: Appropriate  Cognitive: Alert and Oriented   Insight: Developing/Improving and Engaged   Engagement in Therapy: Developing/Improving and Engaged   Modes of Intervention: Clarification, Confrontation, Discussion, Education, Exploration,  Limit-setting, Orientation, Problem-solving, Rapport Building, Dance movement psychotherapisteality Testing, Socialization and Support  Summary of Progress/Problems: The topic for group therapy was feelings about diagnosis. Pt actively participated in group discussion on their past and current diagnosis and how they feel towards this. Pt also identified how society and family members judge them, based on their diagnosis as well as stereotypes and stigmas. Patient stated that she does not know her diagnosis and does not care to share her symptoms at this time.    Hampton AbbotKadijah Churchill Grimsley, MSW, Theresia MajorsLCSWA

## 2016-07-23 NOTE — [facility] Suicide Risk Assessment (Signed)
[facility]   Principal Problem: Major depressive disorder, recurrent severe without psychotic features [facility](HCC) Discharge Diagnoses:  Patient Active Problem List   Diagnosis Date Noted  . PTSD (post-traumatic stress disorder) [F43.10] 07/15/2016  . OCD (obsessive compulsive disorder) [F42.9] 07/15/2016  . Suicidal ideation [R45.851] 07/15/2016  . Major depressive disorder, recurrent severe without psychotic features (HCC) [F33.2] 07/14/2016    Total Time spent with patient: 30 minutes  Musculoskeletal: Strength & Muscle Tone: within normal limits Gait & Station: normal Patient leans: N/A  Psychiatric Specialty Exam: Review of Systems  Psychiatric/Behavioral: Positive for substance abuse. The patient is nervous/anxious.   All other systems reviewed and are negative.   Blood pressure 106/73, pulse 98, temperature 98.7 F (37.1 C), temperature source Oral, resp. rate 20, height 5\' 5"  (1.651 m), weight 63.5 kg (140 lb), last menstrual period 07/04/2016, SpO2 100 %.Body mass index is 23.3 kg/m.  General Appearance: Casual  Eye Contact::  Good  Speech:  Clear and Coherent409  Volume:  Normal  Mood:  Anxious  Affect:  Appropriate  Thought Process:  Goal Directed  Orientation:  Full (Time, Place, and Person)  Thought Content:  WDL  Suicidal Thoughts:  No  Homicidal Thoughts:  No  Memory:  Immediate;   Fair Recent;   Fair Remote;   Fair  Judgement:  Fair  Insight:  Shallow  Psychomotor Activity:  Normal  Concentration:  Fair  Recall:  [employer]Fair  Fund of Knowledge:Fair  Language: Fair  Akathisia:  No  Handed:  Right  AIMS (if indicated):     Assets:  Communication Skills Desire for Improvement Financial Resources/Insurance Physical Health Resilience Social Support  Sleep:  Number of Hours: 5.25  Cognition: WNL  ADL's:  Intact   Mental Status Per Nursing Assessment::   On Admission:     Demographic Factors:  Adolescent or young adult,  Caucasian and Low socioeconomic status  Loss Factors: Loss of significant relationship and Financial problems/change in socioeconomic status  Historical Factors: Prior suicide attempts, Family history of mental illness or substance abuse and Impulsivity  Risk Reduction Factors:   Sense of responsibility to family and Positive social support  Continued Clinical Symptoms:  Depression:   Comorbid alcohol abuse/dependence Impulsivity Alcohol/Substance Abuse/Dependencies  Cognitive Features That Contribute To Risk:  None    Suicide Risk:  Minimal: No identifiable suicidal ideation.  Patients presenting with no risk factors but with morbid ruminations; may be classified as minimal risk based on the severity of the depressive symptoms  Follow-up Information    [facility] .   Why:  DO NOT SEND Contact information: [address] [place] [address] [phone]           Plan Of Care/Follow-up recommendations:  Activity:  as tolerated. Diet:  regular. Other:  keep follow up appointmnts.  Kristine LineaJolanta Lisbet Busker, MD 07/23/2016, 7:37 AM

## 2016-07-23 NOTE — Plan of Care (Signed)
Problem: Health Behavior/Discharge Planning: Goal: Identification of resources available to assist in meeting health care needs will improve Outcome: Progressing Pt discusses with Clinical research associatewriter a desire to [facility] after d/c.  Problem: Safety: Goal: Ability to disclose and discuss suicidal ideas will improve Outcome: Progressing Pt denies SI at this time. She rates depression 8/10.

## 2016-07-23 NOTE — Progress Notes (Signed)
Patient was given her belongings back, no concerns voiced. She was escorted out at 5:10pm to catch her bus to [place]. She had no concerns at the time of discharge, no acute distress noted.

## 2016-07-23 NOTE — Progress Notes (Signed)
Patient refused to take her medications this morning stating that the MD was not going to give her the prescriptions for the medications anyway. Writer explained and showed the patient the prescriptions with all the medications in question but she still refused to take the medications. MD notified. Patient seen interacting with peers and attended groups. Discharge teaching, medications and follow-up appointments discussed with patient. She verbalized understanding. Patient waiting to be escorted to the bus top at 5pm to catch a bus to [place].

## 2016-07-23 NOTE — Plan of Care (Signed)
Problem: Coping: Goal: Ability to verbalize feelings will improve Outcome: Progressing Patient able to verbalize concerns without aggressive behaviors.

## 2016-07-23 NOTE — [facility] Group Notes (Signed)
Goals Group  Date/Time: 9:00AM Type of Therapy and Topic: Group Therapy: Goals Group: SMART Goals  ?  Participation Level: Moderate  ?  Description of Group:  ?  The purpose of a daily goals group is to assist and guide patients in setting recovery/wellness-related goals. The objective is to set goals as they relate to the crisis in which they were admitted. Patients will be using SMART goal modalities to set measurable goals. Characteristics of realistic goals will be discussed and patients will be assisted in setting and processing how one will reach their goal. Facilitator will also assist patients in applying interventions and coping skills learned in psycho-education groups to the SMART goal and process how one will achieve defined goal.  ?  Therapeutic Goals:  ?  -Patients will develop and document one goal related to or their crisis in which brought them into treatment.  -Patients will be guided by LCSW using SMART goal setting modality in how to set a measurable, attainable, realistic and time sensitive goal.  -Patients will process barriers in reaching goal.  -Patients will process interventions in how to overcome and successful in reaching goal.  ?  Patient's Goal: Patient cannot identify goal at this time. ?  Therapeutic Modalities:  Motivational Interviewing  Cognitive Behavioral Therapy  Crisis Intervention Model  SMART goals setting   Lynden OxfordKadijah R. Dereon Corkery, MSW, LCSW-A 07/23/2016,  10:58AM

## 2016-07-23 NOTE — Progress Notes (Signed)
D: Pt is seen in the milieu interacting appropriately with staff and peers. Affect is bright. She rates anxiety 5/10 and depression 8/10. Denies SI/HI/AVH at this time. When asked about groups, pt states "I don't like them." Pt requests PRN trazodone. Pt later up to nurses station c/o difficulty sleeping. MD on call notified and one time dose of restoril ordered and administered. A: Emotional support and encouragement provided. Medications administered with education. q15 minute safety checks maintained. R: Pt remains free from harm. Will continue to monitor.

## 2017-01-22 ENCOUNTER — Emergency Department
Admission: EM | Admit: 2017-01-22 | Discharge: 2017-01-22 | Disposition: A | Discharge status: Home / Self Care | Payer: BLUE CROSS/BLUE SHIELD | Attending: Emergency Medicine | Admitting: Emergency Medicine

## 2017-01-22 ENCOUNTER — Encounter: Payer: Self-pay | Admitting: Emergency Medicine

## 2017-01-22 DIAGNOSIS — F1999 Other psychoactive substance use, unspecified with unspecified psychoactive substance-induced disorder: Secondary | ICD-10-CM | POA: Diagnosis not present

## 2017-01-22 DIAGNOSIS — F1310 Sedative, hypnotic or anxiolytic abuse, uncomplicated: Secondary | ICD-10-CM

## 2017-01-22 DIAGNOSIS — F1910 Other psychoactive substance abuse, uncomplicated: Secondary | ICD-10-CM

## 2017-01-22 DIAGNOSIS — F1990 Other psychoactive substance use, unspecified, uncomplicated: Secondary | ICD-10-CM

## 2017-01-22 LAB — CBC
HEMATOCRIT: 41.7 % (ref 35.0–47.0)
HEMOGLOBIN: 14.1 g/dL (ref 12.0–16.0)
MCH: 30.5 pg (ref 26.0–34.0)
MCHC: 33.9 g/dL (ref 32.0–36.0)
MCV: 90 fL (ref 80.0–100.0)
Platelets: 320 10*3/uL (ref 150–440)
RBC: 4.63 MIL/uL (ref 3.80–5.20)
RDW: 13.3 % (ref 11.5–14.5)
WBC: 8.5 10*3/uL (ref 3.6–11.0)

## 2017-01-22 LAB — COMPREHENSIVE METABOLIC PANEL
ALK PHOS: 103 U/L (ref 38–126)
ALT: 11 U/L — AB (ref 14–54)
AST: 16 U/L (ref 15–41)
Albumin: 4.9 g/dL (ref 3.5–5.0)
Anion gap: 7 (ref 5–15)
BILIRUBIN TOTAL: 0.8 mg/dL (ref 0.3–1.2)
BUN: 8 mg/dL (ref 6–20)
CO2: 28 mmol/L (ref 22–32)
CREATININE: 0.53 mg/dL (ref 0.44–1.00)
Calcium: 9.6 mg/dL (ref 8.9–10.3)
Chloride: 108 mmol/L (ref 101–111)
GFR calc Af Amer: >60 mL/min (ref >60)
GLUCOSE: 90 mg/dL (ref 65–99)
Potassium: 3.6 mmol/L (ref 3.5–5.1)
Sodium: 143 mmol/L (ref 135–145)
TOTAL PROTEIN: 7.8 g/dL (ref 6.5–8.1)

## 2017-01-22 LAB — ACETAMINOPHEN LEVEL

## 2017-01-22 LAB — SALICYLATE LEVEL: Salicylate Lvl: <7 mg/dL (ref 2.8–30.0)

## 2017-01-22 NOTE — ED Notes (Signed)

## 2017-01-22 NOTE — ED Notes (Signed)
She is talking on her cellphone to her friends - I have attempted to collect blood samples but was unable to obtain   Lab contacted

## 2017-01-22 NOTE — ED Notes (Signed)
[name] is consulting at this time

## 2017-01-22 NOTE — ED Triage Notes (Signed)
Says took 3   1 mg klonipen last night "to feel good"   Says she does not want to harm self. Reportedly "unresponsive earlier.  Patient is alert now and has steady gait.

## 2017-01-22 NOTE — BH Assessment (Signed)
Assessment Note  Christina Case is an 21 y.o. female who presents to the ER due to her friends "pressuring" her to do so. Patient states, on last night they went to a "club meeting," for an association they are a part of, through the [employer]. They were "stressing out about the meeting." Thus, one of the friends' offer them a Klonopin and the patient took three of them. "I liked the way the first one made me feel, so I took another one. I was liking that and I took another one. I took three of them." She further explained she was not trying to end her life. "I just liked the feeling." She denies SI/HI and AV/H with this Clinical research associate multiple times, during the interview.  She also states, her friends told her, if she did not come to the ER to "get checked out," they were going to call 911 or the campus police and have her brought to the ER. She decided to come to the ER without law enforcement getting involved.  Patient reports of having two Psychiatric Hospitalizations. The most recent one was 06/2016. It was due to an intentional overdose on her medications. Patient states, the difference between then and now is, "I wasn't trying to kill myself. I just like the way the pills made me feel." During the time she was admitted, the patient was "kicked out" of her parents' home. "I was practically homeless. I was at a [address], for school and I wasn't believing anything they were saying. I felt hopeless and didn't know what else to do. I thought killing myself was the only way out. I'm back with my parents and I really am okay. I don't want to die." The camp was already arranged prior to the patient being "kicked out" of the home.  During the assessment, the patient was irritable and she apologized for her demeanor. She shared she was frustrated because she was forced to come the ER, "and I have work to do for school and I'm up here. I really need to be in class. This is all stupid." Patient had her personal  belongings with her. While Clinical research associate was talking with her, she was receiving text messages and phone calls, on her cellular phone. She would look at the phone, take a deep breath and turn her phone over. She eventually answered one of the calls and at that point the interview as completed.   Past Medical History:  Past Medical History:  Diagnosis Date  . ODD (oppositional defiant disorder)     History reviewed. No pertinent surgical history.  Family History: No family history on file.  Social History:  reports that she has never smoked. She has never used smokeless tobacco. She reports that she drinks alcohol. She reports that she does not use drugs.  Additional Social History:  Alcohol / Drug Use Pain Medications: See PTA Prescriptions: See PTA Over the Counter: See PTA History of alcohol / drug use?: Yes Longest period of sobriety (when/how long): Unable to quantify Withdrawal Symptoms:  (n/a) Substance #1 Name of Substance 1: Alcohol 1 - Age of First Use: Teenager 1 - Amount (size/oz): "Two standard shots" 1 - Frequency: "One time a week, if that." 1 - Duration: Unable to quantify 1 - Last Use / Amount: 08/2016 Substance #2 Name of Substance 2: Klonopin 2 - Age of First Use: 20 2 - Amount (size/oz): "3, 1mg  pills" 2 - Frequency: One time use 2 - Duration: One time use  2 - Last Use / Amount: 01/21/2017  CIWA: CIWA-Ar BP: 126/76 Pulse Rate: (!) 103 COWS:    Allergies:  Allergies  Allergen Reactions  . Hydrocodone Other (See Comments)    dizziness  . Oxycodone Other (See Comments)    dizziness  . Zofran [Ondansetron Hcl] Nausea And Vomiting    Home Medications:  (Not in a hospital admission)  OB/GYN Status:  Patient's last menstrual period was 01/21/2017.  General Assessment Data [facility] Assessment: In system Is this a Tele or Face-to-Face Assessment?: Face-to-Face Is this an Initial Assessment or a Re-assessment for this  encounter?: Initial Assessment Marital status: Single Maiden name: n/a Is patient pregnant?: No Pregnancy Status: No Living Arrangements: Parent Can pt return to current living arrangement?: Yes Admission Status: Voluntary Is patient capable of signing voluntary admission?: Yes Referral Source: Self/Family/Friend Insurance type: Scientist, research (physical sciences)BCBS  Medical Screening Exam [facility] Walk-in ONLY) Medical Exam completed: Yes  Crisis Care Plan Living Arrangements: Parent Legal Guardian: Other: (Self) Name of Psychiatrist: Reports of none Name of Therapist: Reports of none  Education Status Is patient currently in school?: No Current Grade: Freshman in college Highest grade of school patient has completed: 12th Grade Name of school: [employer]  Contact person: n/a  Risk to self with the past 6 months Suicidal Ideation: No-Not Currently/Within Last 6 Months Suicidal Intent: No Has patient had any suicidal intent within the past 6 months prior to admission? : No Is patient at risk for suicide?: No Suicidal Plan?: No-Not Currently/Within Last 6 Months Has patient had any suicidal plan within the past 6 months prior to admission? : Other (comment) Access to Means: No What has been your use of drugs/alcohol within the last 12 months?: Alcohol and Klonopin Previous Attempts/Gestures: Yes How many times?: 2 Other Self Harm Risks: Reports of none  Triggers for Past Attempts: Family contact, Other personal contacts Intentional Self Injurious Behavior: None Family Suicide History: No Recent stressful life event(s): Other (Comment) (Reports of none.) Persecutory voices/beliefs?: No Depression: Yes Depression Symptoms: Guilt Substance abuse history and/or treatment for substance abuse?: Yes Suicide prevention information given to non-admitted patients: Not applicable  Risk to Others within the past 6 months Homicidal Ideation: No Does patient have any lifetime risk of violence toward others  beyond the six months prior to admission? : No Thoughts of Harm to Others: No Current Homicidal Intent: No Current Homicidal Plan: No Access to Homicidal Means: No Identified Victim: Reports of none History of harm to others?: No Assessment of Violence: None Noted Violent Behavior Description: Reports of none Does patient have access to weapons?: No Criminal Charges Pending?: No Does patient have a court date: No Is patient on probation?: No  Psychosis Hallucinations: None noted Delusions: None noted  Mental Status Report Appearance/Hygiene: Unremarkable Eye Contact: Good Motor Activity: Freedom of movement, Unremarkable Speech: Logical/coherent Level of Consciousness: Alert, Irritable Mood: Anxious, Irritable Affect: Irritable, Appropriate to circumstance, Preoccupied Anxiety Level: Minimal Thought Processes: Coherent, Relevant Judgement: Unimpaired Orientation: Person, Place, Time, Situation, Appropriate for developmental age Obsessive Compulsive Thoughts/Behaviors: Minimal  Cognitive Functioning Concentration: Normal Memory: Recent Intact, Remote Intact IQ: Average Insight: Fair Impulse Control: Fair Appetite: Good Weight Loss: 0 Weight Gain: 0 Sleep: No Change Total Hours of Sleep: 6 Vegetative Symptoms: None  ADLScreening [facility] Assessment Services) Patient's cognitive ability adequate to safely complete daily activities?: Yes Patient able to express need for assistance with ADLs?: Yes Independently performs ADLs?: Yes (appropriate for developmental age)  Prior Inpatient Therapy  Prior Inpatient Therapy: Yes Prior Therapy Dates: 06/2016 & 02/2003 Prior Therapy Facilty/Provider(s): [facility] Reason for Treatment: Depression  Prior Outpatient Therapy Prior Outpatient Therapy: No Prior Therapy Dates: Reports of none Prior Therapy Facilty/Provider(s): Reports of none Reason for Treatment: Reports of none Does patient have an ACCT team?: No Does  patient have Intensive In-House Services?  : No Does patient have [facility] services? : No Does patient have P4CC services?: No  ADL Screening (condition at time of admission) Patient's cognitive ability adequate to safely complete daily activities?: Yes Is the patient deaf or have difficulty hearing?: No Does the patient have difficulty seeing, even when wearing glasses/contacts?: No Does the patient have difficulty concentrating, remembering, or making decisions?: No Patient able to express need for assistance with ADLs?: Yes Does the patient have difficulty dressing or bathing?: No Independently performs ADLs?: Yes (appropriate for developmental age) Does the patient have difficulty walking or climbing stairs?: No Weakness of Legs: None Weakness of Arms/Hands: None  Home Assistive Devices/Equipment Home Assistive Devices/Equipment: None  Therapy Consults (therapy consults require a physician order) PT Evaluation Needed: No OT Evalulation Needed: No SLP Evaluation Needed: No Abuse/Neglect Assessment (Assessment to be complete while patient is alone) Physical Abuse: Denies Verbal Abuse: Denies Sexual Abuse: Denies Exploitation of patient/patient's resources: Denies Self-Neglect: Denies Values / Beliefs Cultural Requests During Hospitalization: None Spiritual Requests During Hospitalization: None Consults Spiritual Care Consult Needed: No Social Work Consult Needed: No Merchant navy officer (For Healthcare) Does Patient Have a Medical Advance Directive?: No    Additional Information 1:1 In Past 12 Months?: No CIRT Risk: No Elopement Risk: No Does patient have medical clearance?: Yes  Child/Adolescent Assessment Running Away Risk: Denies (Patient is an adult)  Disposition:  Disposition Initial Assessment Completed for this Encounter: Yes Disposition of Patient: Other dispositions (ER MD ordered Psych Consult)  On Site Evaluation by:   Reviewed with Physician:     Lilyan Gilford MS, LCAS, LPC, NCC, CCSI Therapeutic Triage Specialist 01/22/2017 5:44 PM

## 2017-01-22 NOTE — ED Provider Notes (Signed)
[facility] Emergency Department Provider Note   ____________________________________________    I have reviewed the triage vital signs and the nursing notes.   HISTORY  Chief Complaint Ingestion     HPI Christina Case is a 21 y.o. female who presents to the ED because friends were concerned because the patient took 3 Klonopin last night. Patient reports she did so "just because "and because she likes the way it makes her feel. She denies suicidality. No homicidality. No nausea vomiting or abdominal pain. No chest pain. Denies alcohol use last night. Feels well currently   Past Medical History:  Diagnosis Date  . ODD (oppositional defiant disorder)     Patient Active Problem List   Diagnosis Date Noted  . PTSD (post-traumatic stress disorder) 07/15/2016  . OCD (obsessive compulsive disorder) 07/15/2016  . Suicidal ideation 07/15/2016  . 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 No pertinent surgical history.  Prior to Admission medications   Medication Sig Start Date End Date Taking? Authorizing Provider  buPROPion (WELLBUTRIN XL) 300 MG 24 hr tablet Take 1 tablet (300 mg total) by mouth daily. Patient not taking: Reported on 01/22/2017 03/09/13   Jolene Schimke, NP  buPROPion (WELLBUTRIN XL) 300 MG 24 hr tablet Take 1 tablet (300 mg total) by mouth daily. Patient not taking: Reported on 01/22/2017 07/23/16   Shari Prows, MD  prazosin (MINIPRESS) 2 MG capsule Take 1 capsule (2 mg total) by mouth 2 (two) times daily. Patient not taking: Reported on 01/22/2017 07/19/16   Shari Prows, MD  traZODone (DESYREL) 100 MG tablet Take 1 tablet (100 mg total) by mouth at bedtime. Patient not taking: Reported on 01/22/2017 07/19/16   Shari Prows, MD     Allergies Hydrocodone; Oxycodone; and Zofran [ondansetron hcl]  No family history on file.  Social History Social  History  Substance Use Topics  . Smoking status: Never Smoker  . Smokeless tobacco: Never Used  . Alcohol use Yes     Comment: ocassionally    Review of Systems  Constitutional: NoDizziness Eyes: No visual changes.   Cardiovascular: Denies chest pain. Respiratory: Denies shortness of breath. Gastrointestinal: No abdominal pain.  No nausea, no vomiting.    Musculoskeletal: Negative for back pain. Skin: Negative for rash. Neurological: Negative for headaches   10-point ROS otherwise negative.  ____________________________________________   PHYSICAL EXAM:  VITAL SIGNS: ED Triage Vitals  Enc Vitals Group     BP 01/22/17 1106 122/76     Pulse Rate 01/22/17 1106 100     Resp 01/22/17 1106 16     Temp 01/22/17 1106 98.2 F (36.8 C)     Temp Source 01/22/17 1106 Oral     SpO2 01/22/17 1106 100 %     Weight 01/22/17 1106 135 lb (61.2 kg)     Height 01/22/17 1106 5\' 6"  (1.676 m)     Head Circumference --      Peak Flow --      Pain Score 01/22/17 1117 5     Pain Loc --      Pain Edu? --      Excl. in GC? --     Constitutional: Alert and oriented. No acute distress. Pleasant and interactive Eyes: Conjunctivae are normal.  Head: Atraumatic. Nose: No congestion/rhinnorhea. Mouth/Throat: Mucous membranes are moist.    Cardiovascular: Normal rate, regular rhythm. Grossly normal heart sounds.  Good peripheral circulation. Respiratory:  Normal respiratory effort.  No retractions. Lungs CTAB. Gastrointestinal: Soft and nontender. No distention.  No CVA tenderness.  Musculoskeletal:Warm and well perfused Neurologic:  Normal speech and language. No gross focal neurologic deficits are appreciated.  Skin:  Skin is warm, dry and intact.  Psychiatric: Mood and affect are normal. Speech and behavior are normal.  ____________________________________________   LABS (all labs ordered are listed, but only abnormal results are displayed)  Labs Reviewed  COMPREHENSIVE METABOLIC  PANEL - Abnormal; Notable for the following:       Result Value   ALT 11 (*)    All other components within normal limits  ACETAMINOPHEN LEVEL - Abnormal; Notable for the following:    Acetaminophen (Tylenol), Serum <10 (*)    All other components within normal limits  CBC  SALICYLATE LEVEL   ____________________________________________  EKG  None ____________________________________________  RADIOLOGY  None ____________________________________________   PROCEDURES  Procedure(s) performed: No    Critical Care performed: No ____________________________________________   INITIAL IMPRESSION / ASSESSMENT AND PLAN / ED COURSE  Pertinent labs & imaging results that were available during my care of the patient were reviewed by me and considered in my medical decision making (see chart for details).  Decided to send labs given apparent history of intentional drug overdose in the past which is new information given to TTS  Have consulted Dr. Toni Amendlapacs of psychiatry  ----------------------------------------- 2:21 PM on 01/22/2017 -----------------------------------------  Dr. Toni Amendlapacs has seen the patient and feels she is safe for discharge    ____________________________________________   FINAL CLINICAL IMPRESSION(S) / ED DIAGNOSES  Final diagnoses:  Misuse of prescription only drugs      NEW MEDICATIONS STARTED DURING THIS VISIT:  New Prescriptions   No medications on file     Note:  This document was prepared using Dragon voice recognition software and may include unintentional dictation errors.    Jene Everyobert Patrik Turnbaugh, MD 01/22/17 1434

## 2017-01-22 NOTE — ED Notes (Signed)
BEHAVIORAL HEALTH ROUNDING Patient sleeping: No. Patient alert and oriented: yes Behavior appropriate: Yes.  ; If no, describe:  Nutrition and fluids offered: yes Toileting and hygiene offered: Yes  Sitter present: q15 minute observations and security  monitoring Law enforcement present: Yes  ODS  

## 2017-01-22 NOTE — ED Notes (Addendum)
She reports that last evening she took 3-1mg  klonipin pills in an attempt to  "catch a buzz"   I asked her about SI and she denied at this time   Asked about her admission last fall and which therapist and/or meds she has been see/taking.  Pt stated  "I don't have a therapist and I don't need a therapist - I get my drugs where I can.  I live at home with my parents but last night I stayed with friends."

## 2017-01-22 NOTE — ED Notes (Signed)
[employer] officer Tera HelperBoggs is sitting by the bed talking with the pt   The officer is reporting to me increased concern of pts suicidal intent due to her friends in the lobby have reported to the officer that she has had suicidal attempt in the past

## 2017-01-22 NOTE — ED Notes (Addendum)
EDP has seen the pt -  TTS and psych consult have been ordered  Pt was not changed out in triage and she has all of her belongings including a laptop cellphone and a bookbag full of safety concerns  Pt should have been changed into scrubs and belongings secured per protocol prior to her arrival to the quad   Increased monitoring to be performed  for safety purposes

## 2017-01-22 NOTE — ED Notes (Signed)
Pt was brought back in pt clothes with pt belongings. Pt is currently playing on a laptop. When ask if we need blood the answer was no.

## 2017-01-22 NOTE — Consult Note (Signed)
[facility] Consult   Reason for Consult:  Consult for 21 year old woman who came voluntarily to the emergency room at the insistence of her friends Referring Physician:  Corky Downs Patient Identification: Christina Case MRN:  [id number] Principal Diagnosis: Sedative abuse Diagnosis:   Patient Active Problem List   Diagnosis Date Noted  . 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 PTSD (post-traumatic stress disorder) [F43.10] 07/15/2016  . OCD (obsessive compulsive disorder) [F42.9] 07/15/2016  . Suicidal ideation [R45.851] 07/15/2016  . Major depressive disorder, recurrent severe without psychotic features ([place]) [F33.2] 07/14/2016    Total Time spent with patient: 1 hour  Subjective:   Christina Case is a 21 y.o. female patient admitted with "I took 3 Klonopin and I am a light weight".  HPI:  Patient interviewed chart reviewed. 21 year old woman came voluntarily to the emergency room at the urging of her friends. Patient admits that last night she was out partying with her friends and took 3 mg of clonazepam. She says she did it entirely in order to get high and have a good time. She admits that she does not regularly use Klonopin and when she has in the past has not used that much of it but consistently states there was no thought about hurting herself. Denies that she was drinking or using any other drugs at the time. Patient says her mood recently has been fine. Doesn't admit to any depressive symptoms. Says she sleeps and eats fine. Denies any suicidal ideation denies any psychosis denies feeling particularly anxious. Says she feels like her stress level is reasonably under control.  Medical history: No significant known medical problems no evidence of any acute medical problems  Substance abuse history: Looking through her old chart the patient has a documented past history of abuse of benzodiazepines and alcohol. She denies that she has been drinking anytime recently. Admits  that she occasionally uses Klonopin to get high but not every day.  Social history: Patient is a Contractor at [employer]. She is living at home with her parents who live here in town. She says her relationship with her parents is "okay" even though it looks like they are very close and that there is been a lot of drama in the past.  Past Psychiatric History: Patient has had 3 prior hospitalizations that we know of. One of them at behavioral health when she was a teenager one of them in [place] and one of them here at our hospital last autumn. At that time she was having suicidal thoughts but denied any actual suicide attempts. She denies that she is ever tried to kill her self in the past. She says that she flunked out of [employer] all of last year because she was too tired to go to classes. She doesn't seem to have any speculation on whether that is a persisting problem but says that she is feeling fine now. Is not on any psychiatric medicine and is not getting any regular treatment.  Risk to Self: Is patient at risk for suicide?: No Risk to Others:   Prior Inpatient Therapy:   Prior Outpatient Therapy:    Past Medical History:  Past Medical History:  Diagnosis Date  . ODD (oppositional defiant disorder)    History reviewed. No pertinent surgical history. Family History: No family history on file. Family Psychiatric  History: Denies knowledge of any family history Social History:  History  Alcohol Use  . Yes    Comment: ocassionally  History  Drug Use No    Social History   Social History  . Marital status: Single    Spouse name: N/A  . Number of children: N/A  . Years of education: N/A   Social History Main Topics  . Smoking status: Never Smoker  . Smokeless tobacco: Never Used  . Alcohol use Yes     Comment: ocassionally  . Drug use: No  . Sexual activity: Not Asked   Other Topics Concern  . None   Social History Narrative  . None   Additional  Social History:    Allergies:   Allergies  Allergen Reactions  . Hydrocodone Other (See Comments)    dizziness  . Oxycodone Other (See Comments)    dizziness  . Zofran [Ondansetron Hcl] Nausea And Vomiting    Labs:  Results for orders placed or performed during the hospital encounter of 01/22/17 (from the past 48 hour(s))  CBC     Status: None   Collection Time: 01/22/17  1:39 PM  Result Value Ref Range   WBC 8.5 3.6 - 11.0 K/uL   RBC 4.63 3.80 - 5.20 MIL/uL   Hemoglobin 14.1 12.0 - 16.0 g/dL   HCT 41.7 35.0 - 47.0 %   MCV 90.0 80.0 - 100.0 fL   MCH 30.5 26.0 - 34.0 pg   MCHC 33.9 32.0 - 36.0 g/dL   RDW 13.3 11.5 - 14.5 %   Platelets 320 150 - 440 K/uL  Comprehensive metabolic panel     Status: Abnormal   Collection Time: 01/22/17  1:39 PM  Result Value Ref Range   Sodium 143 135 - 145 mmol/L   Potassium 3.6 3.5 - 5.1 mmol/L   Chloride 108 101 - 111 mmol/L   CO2 28 22 - 32 mmol/L   Glucose, Bld 90 65 - 99 mg/dL   BUN 8 6 - 20 mg/dL   Creatinine, Ser 0.53 0.44 - 1.00 mg/dL   Calcium 9.6 8.9 - 10.3 mg/dL   Total Protein 7.8 6.5 - 8.1 g/dL   Albumin 4.9 3.5 - 5.0 g/dL   AST 16 15 - 41 U/L   ALT 11 (L) 14 - 54 U/L   Alkaline Phosphatase 103 38 - 126 U/L   Total Bilirubin 0.8 0.3 - 1.2 mg/dL   GFR calc non Af Amer >60 >60 mL/min   GFR calc Af Amer >60 >60 mL/min    Comment: (NOTE) The eGFR has been calculated using the CKD EPI equation. This calculation has not been validated in all clinical situations. eGFR's persistently <60 mL/min signify possible Chronic Kidney Disease.    Anion gap 7 5 - 15  Acetaminophen level     Status: Abnormal   Collection Time: 01/22/17  1:39 PM  Result Value Ref Range   Acetaminophen (Tylenol), Serum <10 (L) 10 - 30 ug/mL    Comment:        THERAPEUTIC CONCENTRATIONS VARY SIGNIFICANTLY. A RANGE OF 10-30 ug/mL MAY BE AN EFFECTIVE CONCENTRATION FOR MANY PATIENTS. HOWEVER, SOME ARE BEST TREATED AT CONCENTRATIONS OUTSIDE  THIS RANGE. ACETAMINOPHEN CONCENTRATIONS >150 ug/mL AT 4 HOURS AFTER INGESTION AND >50 ug/mL AT 12 HOURS AFTER INGESTION ARE OFTEN ASSOCIATED WITH TOXIC REACTIONS.   Salicylate level     Status: None   Collection Time: 01/22/17  1:39 PM  Result Value Ref Range   Salicylate Lvl <6.4 2.8 - 30.0 mg/dL    No current facility-administered medications for this encounter.    Current Outpatient Prescriptions  Medication  Sig Dispense Refill  . buPROPion (WELLBUTRIN XL) 300 MG 24 hr tablet Take 1 tablet (300 mg total) by mouth daily. (Patient not taking: Reported on 01/22/2017) 30 tablet 0  . buPROPion (WELLBUTRIN XL) 300 MG 24 hr tablet Take 1 tablet (300 mg total) by mouth daily. (Patient not taking: Reported on 01/22/2017) 30 tablet 1  . prazosin (MINIPRESS) 2 MG capsule Take 1 capsule (2 mg total) by mouth 2 (two) times daily. (Patient not taking: Reported on 01/22/2017) 60 capsule 1  . traZODone (DESYREL) 100 MG tablet Take 1 tablet (100 mg total) by mouth at bedtime. (Patient not taking: Reported on 01/22/2017) 30 tablet 1    Musculoskeletal: Strength & Muscle Tone: within normal limits Gait & Station: normal Patient leans: N/A  Psychiatric Specialty Exam: Physical Exam  Nursing note and vitals reviewed. Constitutional: She appears well-developed and well-nourished.  HENT:  Head: Normocephalic and atraumatic.  Eyes: Conjunctivae are normal. Pupils are equal, round, and reactive to light.  Neck: Normal range of motion.  Cardiovascular: Regular rhythm and normal heart sounds.   Respiratory: Effort normal. No respiratory distress.  GI: Soft.  Musculoskeletal: Normal range of motion.  Neurological: She is alert.  Skin: Skin is warm and dry.  Psychiatric: She has a normal mood and affect. Her behavior is normal. Thought content normal. She expresses inappropriate judgment.    Review of Systems  Constitutional: Negative.   HENT: Negative.   Eyes: Negative.   Respiratory: Negative.    Cardiovascular: Negative.   Gastrointestinal: Negative.   Musculoskeletal: Negative.   Skin: Negative.   Neurological: Negative.   Psychiatric/Behavioral: Positive for substance abuse. Negative for depression, hallucinations, memory loss and suicidal ideas. The patient is not nervous/anxious and does not have insomnia.     Blood pressure 126/76, pulse (!) 103, temperature 98.2 F (36.8 C), temperature source Oral, resp. rate 16, height _0  (1.676 m), weight 61.2 kg (135 lb), last menstrual period 01/21/2017, SpO2 100 %.Body mass index is 21.79 kg/m.  General Appearance: Fairly Groomed  Eye Contact:  Good  Speech:  Clear and Coherent  Volume:  Decreased  Mood:  Euthymic  Affect:  Constricted  Thought Process:  Goal Directed  Orientation:  Full (Time, Place, and Person)  Thought Content:  Logical  Suicidal Thoughts:  No  Homicidal Thoughts:  No  Memory:  Immediate;   Good Recent;   Fair Remote;   Fair  Judgement:  Impaired  Insight:  Shallow  Psychomotor Activity:  Normal  Concentration:  Concentration: Fair  Recall:  [employer] of Knowledge:  Fair  Language:  Fair  Akathisia:  No  Handed:  Right  AIMS (if indicated):     Assets:  Housing Physical Health Resilience  ADL's:  Intact  Cognition:  WNL  Sleep:        Treatment Plan Summary: Plan 21 year old woman who apparently took too much clonazepam last night and started to act strangely and had to be carried home by her friends. No symptoms this morning. Totally denies any suicidal ideation or intent. She does have a past history of suicidal ideation and hospitalizations and mood instability. Talking with her today one gets the sense of very limited insight. No evidence however of psychosis. Does make me wonder if she may be at risk for an evolving personality disorder issue. Patient however does not meet commitment criteria. Does not require any inpatient treatment and there is no clear indication for any medication.  Patient was advised about the  dangers of getting back into using sedatives including the danger of addiction and dependence and of the intoxication causing her to do things she would not normally do. I encouraged her to consider strongly getting back in touch with the therapist she saw previously. Case reviewed with TTS and emergency room physician.  Disposition: Patient does not meet criteria for psychiatric inpatient admission. Supportive therapy provided about ongoing stressors.  Alethia Berthold, MD 01/22/2017 2:46 PM

## 2017-01-22 NOTE — ED Notes (Signed)
[employer] Officer Tera HelperBoggs is standing talking to the pt

## 2017-05-20 ENCOUNTER — Other Ambulatory Visit: Payer: Self-pay | Admitting: Nurse Practitioner

## 2017-05-20 DIAGNOSIS — R1310 Dysphagia, unspecified: Secondary | ICD-10-CM

## 2017-05-20 DIAGNOSIS — R1013 Epigastric pain: Secondary | ICD-10-CM

## 2017-05-22 DIAGNOSIS — R1319 Other dysphagia: Secondary | ICD-10-CM | POA: Insufficient documentation

## 2017-05-22 DIAGNOSIS — R1013 Epigastric pain: Secondary | ICD-10-CM | POA: Insufficient documentation

## 2017-05-23 ENCOUNTER — Ambulatory Visit (INDEPENDENT_AMBULATORY_CARE_PROVIDER_SITE_OTHER): Payer: BLUE CROSS/BLUE SHIELD | Admitting: Advanced Practice Midwife

## 2017-05-23 ENCOUNTER — Encounter: Payer: Self-pay | Admitting: Advanced Practice Midwife

## 2017-05-23 VITALS — BP 118/74 | Ht 66.0 in | Wt 136.0 lb

## 2017-05-23 DIAGNOSIS — Z30011 Encounter for initial prescription of contraceptive pills: Secondary | ICD-10-CM | POA: Diagnosis not present

## 2017-05-23 DIAGNOSIS — Z01419 Encounter for gynecological examination (general) (routine) without abnormal findings: Secondary | ICD-10-CM

## 2017-05-23 DIAGNOSIS — Z0000 Encounter for general adult medical examination without abnormal findings: Secondary | ICD-10-CM

## 2017-05-23 MED ORDER — NORETHINDRONE 0.35 MG PO TABS: 1.0000 | ORAL_TABLET | Freq: Every day | ORAL | 11 refills | Status: DC

## 2017-05-25 NOTE — Progress Notes (Signed)
Patient ID: Marcia BrashCaitlin M Martelle, female   DOB: September 06, 1996, 21 y.o.   MRN: [id number]      Gynecology Annual Exam  PCP: Mickie BailSator Nogo, Jasna, MD  Chief Complaint:  Chief Complaint  Patient presents with  . Menstrual Problem    History of Present Illness: Patient is a 21 y.o. G0P0000 presents for annual exam. The patient has complaints today of irregular, painful and heavy periods. She says they have been irregular for the past 2 years and she is requesting hormonal control.    LMP: Patient's last menstrual period was 04/25/2017. Menarche:13 Average Interval: irregular, 29-85 days Duration of flow: 6 days Heavy Menses: yes Clots: yes Intermenstrual Bleeding: no Postcoital Bleeding: no Dysmenorrhea: yes  The patient is not currently sexually active. She currently uses none for contraception. She denies dyspareunia.  The patient does not perform self breast exams.  There is no notable family history of breast or ovarian cancer in her family.  The patient wears seatbelts: yes.  The patient has regular exercise: yes.  She admits to needing to improve healthy diet.  The patient denies current symptoms of depression. The patient states that the mental health diagnoses on her medical record are resolved.    Review of Systems: Review of Systems  Constitutional: Negative.   HENT: Negative.   Eyes: Negative.   Respiratory: Negative.   Cardiovascular: Negative.   Gastrointestinal: Negative.   Genitourinary: Negative.   Musculoskeletal: Negative.   Skin: Negative.   Neurological: Negative.   Endo/Heme/Allergies: Negative.   Psychiatric/Behavioral: Negative.     Past Medical History:  Past Medical History:  Diagnosis Date  . ODD (oppositional defiant disorder)     Past Surgical History:  History reviewed. No pertinent surgical history.  Gynecologic History:  Patient's last menstrual period was 04/25/2017. Contraception: none Last Pap: Results were: has never had a PAP smear    Obstetric History: G0P0000  Family History:  History reviewed. No pertinent family history.  Social History:  Social History   Social History  . Marital status: Single    Spouse name: N/A  . Number of children: N/A  . Years of education: N/A   Occupational History  . Not on file.   Social History Main Topics  . Smoking status: Never Smoker  . Smokeless tobacco: Never Used  . Alcohol use Yes     Comment: ocassionally  . Drug use: No  . Sexual activity: Not Currently    Birth control/ protection: None   Other Topics Concern  . Not on file   Social History Narrative  . No narrative on file    Allergies:  Allergies  Allergen Reactions  . Hydrocodone Other (See Comments)    dizziness  . Oxycodone Other (See Comments)    dizziness  . Zofran [Ondansetron Hcl] Nausea And Vomiting    Medications: Prior to Admission medications   Medication Sig Start Date End Date Taking? Authorizing Provider  omeprazole (PRILOSEC) 20 MG capsule Take 20 mg by mouth daily.   Yes [provider]  FLORASTOR 250 MG capsule Take 250 mg by mouth 2 (two) times daily. 05/09/17   [provider]  norethindrone (MICRONOR,CAMILA,ERRIN) 0.35 MG tablet Take 1 tablet (0.35 mg total) by mouth daily. 05/23/17   Tresea MallGledhill, Adler Chartrand, CNM    Physical Exam Vitals: Blood pressure 118/74, height 5\' 6"  (1.676 m), weight 136 lb (61.7 kg), last menstrual period 04/25/2017.  General: NAD HEENT: normocephalic, anicteric Thyroid: no enlargement, no palpable nodules Pulmonary: No increased work  of breathing, CTAB Cardiovascular: RRR, distal pulses 2+ Breast: Breast symmetrical, no tenderness, no palpable nodules or masses, no skin or nipple retraction present, no nipple discharge.  No axillary or supraclavicular lymphadenopathy. Abdomen: NABS, soft, non-tender, non-distended.  Umbilicus without lesions.  No hepatomegaly, splenomegaly or masses palpable. No evidence of hernia   Genitourinary:  External: Normal external female genitalia.  Normal urethral meatus, normal  Bartholin's and Skene's glands.    Vagina: Normal vaginal mucosa, no evidence of prolapse.    Cervix: Grossly normal in appearance, no bleeding, no CMT  Uterus: Non-enlarged, mobile, normal contour.    Adnexa: ovaries non-enlarged, no adnexal masses  Rectal: deferred  Lymphatic: no evidence of inguinal lymphadenopathy Extremities: no edema, erythema, or tenderness Neurologic: Grossly intact Psychiatric: mood appropriate, affect full   Assessment: 21 y.o. G0P0000 Well woman exam  Plan: Problem List Items Addressed This Visit    None    Visit Diagnoses    Well woman exam (no gynecological exam)    -  Primary   Relevant Medications   norethindrone (MICRONOR,CAMILA,ERRIN) 0.35 MG tablet   Encounter for initial prescription of contraceptive pills       Relevant Medications   norethindrone (MICRONOR,CAMILA,ERRIN) 0.35 MG tablet      1) 4) Gardasil Series discussed and if applicable offered to patient - Patient has not previously completed 3 shot series   2) STI screening was offered and declined   3) ASCCP guidelines and rational discussed.  Patient opts for beginning at age 39 screening interval  4) Contraception - Education given regarding options for contraception, including pills, injection, LARCs. Patient chooses to begin with progesterone-only pill for control of irregular and heavy periods and will schedule IUD insertion appointment   5) Follow up 1 year for routine annual exam   Tresea Mall, CNM

## 2017-05-27 ENCOUNTER — Ambulatory Visit
Admission: RE | Admit: 2017-05-27 | Discharge: 2017-05-27 | Disposition: A | Discharge status: Home / Self Care | Payer: BLUE CROSS/BLUE SHIELD | Source: Ambulatory Visit | Attending: Nurse Practitioner | Admitting: Nurse Practitioner

## 2017-05-27 DIAGNOSIS — R1013 Epigastric pain: Secondary | ICD-10-CM | POA: Diagnosis present

## 2017-05-27 DIAGNOSIS — R1310 Dysphagia, unspecified: Secondary | ICD-10-CM

## 2017-09-22 ENCOUNTER — Ambulatory Visit
Admission: RE | Admit: 2017-09-22 | Payer: BLUE CROSS/BLUE SHIELD | Source: Ambulatory Visit | Admitting: Unknown Physician Specialty

## 2017-09-22 ENCOUNTER — Encounter: Admission: RE | Payer: Self-pay | Source: Ambulatory Visit

## 2017-09-22 SURGERY — ESOPHAGOGASTRODUODENOSCOPY (EGD) WITH PROPOFOL
Anesthesia: General

## 2017-11-02 ENCOUNTER — Other Ambulatory Visit: Payer: Self-pay

## 2017-11-02 ENCOUNTER — Emergency Department
Admission: EM | Admit: 2017-11-02 | Discharge: 2017-11-02 | Disposition: A | Discharge status: Home / Self Care | Payer: BLUE CROSS/BLUE SHIELD | Attending: Emergency Medicine | Admitting: Emergency Medicine

## 2017-11-02 ENCOUNTER — Emergency Department: Payer: BLUE CROSS/BLUE SHIELD

## 2017-11-02 ENCOUNTER — Encounter: Payer: Self-pay | Admitting: Emergency Medicine

## 2017-11-02 DIAGNOSIS — N200 Calculus of kidney: Secondary | ICD-10-CM | POA: Insufficient documentation

## 2017-11-02 DIAGNOSIS — Z79899 Other long term (current) drug therapy: Secondary | ICD-10-CM | POA: Diagnosis not present

## 2017-11-02 DIAGNOSIS — R109 Unspecified abdominal pain: Secondary | ICD-10-CM | POA: Diagnosis present

## 2017-11-02 LAB — URINALYSIS, COMPLETE (UACMP) WITH MICROSCOPIC
BACTERIA UA: NONE SEEN
BILIRUBIN URINE: NEGATIVE
Glucose, UA: NEGATIVE mg/dL
KETONES UR: NEGATIVE mg/dL
LEUKOCYTES UA: NEGATIVE
Nitrite: NEGATIVE
PROTEIN: 30 mg/dL — AB
Specific Gravity, Urine: 1.015 (ref 1.005–1.030)
pH: 6 (ref 5.0–8.0)

## 2017-11-02 LAB — BASIC METABOLIC PANEL
Anion gap: 8 (ref 5–15)
BUN: 9 mg/dL (ref 6–20)
CALCIUM: 9.1 mg/dL (ref 8.9–10.3)
CO2: 22 mmol/L (ref 22–32)
CREATININE: 0.62 mg/dL (ref 0.44–1.00)
Chloride: 109 mmol/L (ref 101–111)
Glucose, Bld: 87 mg/dL (ref 65–99)
Potassium: 4.2 mmol/L (ref 3.5–5.1)
SODIUM: 139 mmol/L (ref 135–145)

## 2017-11-02 LAB — CBC
HCT: 39.1 % (ref 35.0–47.0)
Hemoglobin: 13.3 g/dL (ref 12.0–16.0)
MCH: 30 pg (ref 26.0–34.0)
MCHC: 34.1 g/dL (ref 32.0–36.0)
MCV: 88.1 fL (ref 80.0–100.0)
PLATELETS: 299 10*3/uL (ref 150–440)
RBC: 4.44 MIL/uL (ref 3.80–5.20)
RDW: 12.8 % (ref 11.5–14.5)
WBC: 6.5 10*3/uL (ref 3.6–11.0)

## 2017-11-02 LAB — POCT PREGNANCY, URINE: PREG TEST UR: NEGATIVE

## 2017-11-02 MED ORDER — KETOROLAC TROMETHAMINE 30 MG/ML IJ SOLN
30.0000 mg | Freq: Once | INTRAMUSCULAR | Status: AC
  Administered 2017-11-02: 30 mg via INTRAVENOUS
  Filled 2017-11-02: qty 1

## 2017-11-02 MED ORDER — KETOROLAC TROMETHAMINE 10 MG PO TABS: 10.0000 mg | ORAL_TABLET | Freq: Four times a day (QID) | ORAL | 0 refills | Status: DC | PRN

## 2017-11-02 MED ORDER — OXYCODONE-ACETAMINOPHEN 5-325 MG PO TABS
ORAL_TABLET | ORAL | Status: AC
  Administered 2017-11-02: 1 via ORAL
  Filled 2017-11-02: qty 1

## 2017-11-02 MED ORDER — OXYCODONE-ACETAMINOPHEN 5-325 MG PO TABS
1.0000 | ORAL_TABLET | ORAL | Status: DC | PRN
  Administered 2017-11-02: 1 via ORAL

## 2017-11-02 NOTE — ED Provider Notes (Signed)
[facility] Emergency Department Provider Note    First MD Initiated Contact with Patient 11/02/17 1306     (approximate)  I have reviewed the triage vital signs and the nursing notes.   HISTORY  Chief Complaint Flank Pain   HPI Christina Case is a 21 y.o. female with below list of chronic medical conditions including kidney stones presents to the emergency department with right flank pain with acute onset today accompanied by nausea and vomiting.  Patient received a Percocet while in the waiting room and states that her pain is patient denies any dysuria no fever.  Patient does admit to dark urine.   Past Medical History:  Diagnosis Date  . ODD (oppositional defiant disorder)     Patient Active Problem List   Diagnosis Date Noted  . Epigastric pain 05/22/2017  . Other dysphagia 05/22/2017  . 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 PTSD (post-traumatic stress disorder) 07/15/2016  . OCD (obsessive compulsive disorder) 07/15/2016  . Suicidal ideation 07/15/2016  . 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 No pertinent surgical history.  Prior to Admission medications   Medication Sig Start Date End Date Taking? Authorizing Provider  FLORASTOR 250 MG capsule Take 250 mg by mouth 2 (two) times daily. 05/09/17   [provider]  norethindrone (MICRONOR,CAMILA,ERRIN) 0.35 MG tablet Take 1 tablet (0.35 mg total) by mouth daily. 05/23/17   Tresea MallGledhill, Jane, CNM  omeprazole (PRILOSEC) 20 MG capsule Take 20 mg by mouth daily.    [provider]    Allergies Hydrocodone; Oxycodone; and Zofran [ondansetron hcl]  History reviewed. No pertinent family history.  Social History Social History   Tobacco Use  . Smoking status: Never Smoker  . Smokeless tobacco: Never Used  Substance Use Topics  . Alcohol use: Yes    Comment: ocassionally  . Drug use: No    Review of  Systems Constitutional: No fever/chills Eyes: No visual changes. ENT: No sore throat. Cardiovascular: Denies chest pain. Respiratory: Denies shortness of breath. Gastrointestinal: No abdominal pain.  No nausea, no vomiting.  No diarrhea.  No constipation.  Positive for right flank pain Genitourinary: Negative for dysuria. Musculoskeletal: Negative for neck pain.  Negative for back pain. Integumentary: Negative for rash. Neurological: Negative for headaches, focal weakness or numbness.  ____________________________________________   PHYSICAL EXAM:  VITAL SIGNS: ED Triage Vitals  Enc Vitals Group     BP 11/02/17 1126 120/79     Pulse Rate 11/02/17 1126 81     Resp 11/02/17 1126 18     Temp 11/02/17 1126 98.3 F (36.8 C)     Temp Source 11/02/17 1126 Oral     SpO2 11/02/17 1126 98 %     Weight 11/02/17 1126 63.5 kg (140 lb)     Height 11/02/17 1126 1.676 m (5\' 6" )     Head Circumference --      Peak Flow --      Pain Score 11/02/17 1131 5     Pain Loc --      Pain Edu? --      Excl. in GC? --     Constitutional: Alert and oriented. Well appearing and in no acute distress. Eyes: Conjunctivae are normal.  Head: Atraumatic. Mouth/Throat: Mucous membranes are moist.  Oropharynx non-erythematous. Neck: No stridor.   Cardiovascular: Normal rate, regular rhythm. Good peripheral circulation. Grossly normal heart sounds. Respiratory: Normal respiratory effort.  No retractions. Lungs CTAB. Gastrointestinal:  Soft and nontender. No distention.  Musculoskeletal: No lower extremity tenderness nor edema. No gross deformities of extremities. Neurologic:  Normal speech and language. No gross focal neurologic deficits are appreciated.  Skin:  Skin is warm, dry and intact. No rash noted. Psychiatric: Mood and affect are normal. Speech and behavior are normal.  ____________________________________________   LABS (all labs ordered are listed, but only abnormal results are  displayed)  Labs Reviewed  URINALYSIS, COMPLETE (UACMP) WITH MICROSCOPIC - Abnormal; Notable for the following components:      Result Value   Color, Urine YELLOW (*)    APPearance HAZY (*)    Hgb urine dipstick LARGE (*)    Protein, ur 30 (*)    Squamous Epithelial / LPF 0-5 (*)    All other components within normal limits  BASIC METABOLIC PANEL  CBC  POC URINE PREG, ED  POCT PREGNANCY, URINE    RADIOLOGY I, [address] Heide Brossart, personally viewed and evaluated these images (plain radiographs) as part of my medical decision making, as well as reviewing the written report by the radiologist.  Ct Renal Stone Study  Result Date: 11/02/2017 CLINICAL DATA:  Acute right flank pain EXAM: CT ABDOMEN AND PELVIS WITHOUT CONTRAST TECHNIQUE: Multidetector CT imaging of the abdomen and pelvis was performed following the standard protocol without IV contrast. COMPARISON:  05/27/2017 FINDINGS: Lower chest: No acute abnormality. Hepatobiliary: No focal liver abnormality is seen. No gallstones, gallbladder wall thickening, or biliary dilatation. Pancreas: Unremarkable. No pancreatic ductal dilatation or surrounding inflammatory changes. Spleen: Normal in size without focal abnormality. Adrenals/Urinary Tract: Adrenal glands are within normal limits. Kidneys are well visualized bilaterally and demonstrate scattered nonobstructing renal calculi measuring 2-3 mm. No significant obstructive changes are noted. Stomach/Bowel: Stomach is within normal limits. Appendix appears normal. No evidence of bowel wall thickening, distention, or inflammatory changes. Vascular/Lymphatic: No significant vascular findings are present. No enlarged abdominal or pelvic lymph nodes. Reproductive: Uterus and bilateral adnexa are unremarkable. Other: No abdominal wall hernia or abnormality. No abdominopelvic ascites. Musculoskeletal: No acute or significant osseous findings. IMPRESSION: Bilateral nonobstructing renal stones. No  obstructive changes are seen. No other focal abnormality is noted. Electronically Signed   By: Alcide CleverMark  Lukens M.D.   On: 11/02/2017 14:03     Procedures   ____________________________________________   INITIAL IMPRESSION / ASSESSMENT AND PLAN / ED COURSE  As part of my medical decision making, I reviewed the following data within the electronic MEDICAL RECORD [age] year old female presented with a history and physical exam concerning for possible kidney stone.  Patient received IV Toradol 30 mg.  CT scan revealed bilateral nonobstructing nephrolithiasis.  Patient will be discharged home with outpatient follow-up. ____________________________________________  FINAL CLINICAL IMPRESSION(S) / ED DIAGNOSES  Final diagnoses:  Kidney stone     MEDICATIONS GIVEN DURING THIS VISIT:  Medications  oxyCODONE-acetaminophen (PERCOCET/ROXICET) 5-325 MG per tablet 1 tablet (1 tablet Oral Given 11/02/17 1142)  ketorolac (TORADOL) 30 MG/ML injection 30 mg (30 mg Intravenous Given 11/02/17 1351)     ED Discharge Orders    None       Note:  This document was prepared using Dragon voice recognition software and may include unintentional dictation errors.    Darci CurrentBrown,  N, MD 11/04/17 1440

## 2017-11-02 NOTE — ED Notes (Signed)
Patient transported to CT 

## 2017-11-02 NOTE — ED Triage Notes (Signed)
Pt presents to ED via POV c/o R flank pain. Hx kidney stones. +nausea

## 2017-11-02 NOTE — ED Notes (Signed)
FIRST NURSE NOTE:  Pt here c/o vomiting and right flank pain radiating to the front. Sxs started today.  Pt thinks it's a kidney stone.

## 2017-11-02 NOTE — ED Notes (Signed)
ED Provider at bedside. 

## 2017-11-21 ENCOUNTER — Ambulatory Visit: Payer: BLUE CROSS/BLUE SHIELD | Admitting: Obstetrics and Gynecology

## 2017-11-21 ENCOUNTER — Encounter: Payer: Self-pay | Admitting: Obstetrics and Gynecology

## 2017-11-21 VITALS — BP 100/60 | HR 73 | Ht 66.0 in | Wt 140.0 lb

## 2017-11-21 DIAGNOSIS — N926 Irregular menstruation, unspecified: Secondary | ICD-10-CM

## 2017-11-21 DIAGNOSIS — N911 Secondary amenorrhea: Secondary | ICD-10-CM

## 2017-11-21 DIAGNOSIS — N939 Abnormal uterine and vaginal bleeding, unspecified: Secondary | ICD-10-CM

## 2017-11-21 DIAGNOSIS — N970 Female infertility associated with anovulation: Secondary | ICD-10-CM

## 2017-11-21 NOTE — Patient Instructions (Signed)
Dysmenorrhea Menstrual cramps (dysmenorrhea) are caused by the muscles of the uterus tightening (contracting) during a menstrual period. For some women, this discomfort is merely bothersome. For others, dysmenorrhea can be severe enough to interfere with everyday activities for a few days each month. Primary dysmenorrhea is menstrual cramps that last a couple of days when you start having menstrual periods or soon after. This often begins after a teenager starts having her period. As a woman gets older or has a baby, the cramps will usually lessen or disappear. Secondary dysmenorrhea begins later in life, lasts longer, and the pain may be stronger than primary dysmenorrhea. The pain may start before the period and last a few days after the period. What are the causes? Dysmenorrhea is usually caused by an underlying problem, such as: The tissue lining the uterus grows outside of the uterus in other areas of the body (endometriosis). The endometrial tissue, which normally lines the uterus, is found in or grows into the muscular walls of the uterus (adenomyosis). The pelvic blood vessels are engorged with blood just before the menstrual period (pelvic congestive syndrome). Overgrowth of cells (polyps) in the lining of the uterus or cervix. Falling down of the uterus (prolapse) because of loose or stretched ligaments. Depression. Bladder problems, infection, or inflammation. Problems with the intestine, a tumor, or irritable bowel syndrome. Cancer of the female organs or bladder. A severely tipped uterus. A very tight opening or closed cervix. Noncancerous tumors of the uterus (fibroids). Pelvic inflammatory disease (PID). Pelvic scarring (adhesions) from a previous surgery. Ovarian cyst. An intrauterine device (IUD) used for birth control.  What increases the risk? You may be at greater risk of dysmenorrhea if: You are younger than age 21. You started puberty early. You have irregular or heavy  bleeding. You have never given birth. You have a family history of this problem. You are a smoker.  What are the signs or symptoms? Cramping or throbbing pain in your lower abdomen. Headaches. Lower back pain. Nausea or vomiting. Diarrhea. Sweating or dizziness. Loose stools. How is this diagnosed? A diagnosis is based on your history, symptoms, physical exam, diagnostic tests, or procedures. Diagnostic tests or procedures may include: Blood tests. Ultrasonography. An examination of the lining of the uterus (dilation and curettage, D&C). An examination inside your abdomen or pelvis with a scope (laparoscopy). X-rays. CT scan. MRI. An examination inside the bladder with a scope (cystoscopy). An examination inside the intestine or stomach with a scope (colonoscopy, gastroscopy).  How is this treated? Treatment depends on the cause of the dysmenorrhea. Treatment may include: Pain medicine prescribed by your health care provider. Birth control pills or an IUD with progesterone hormone in it. Hormone replacement therapy. Nonsteroidal anti-inflammatory drugs (NSAIDs). These may help stop the production of prostaglandins. Surgery to remove adhesions, endometriosis, ovarian cyst, or fibroids. Removal of the uterus (hysterectomy). Progesterone shots to stop the menstrual period. Cutting the nerves on the sacrum that go to the female organs (presacral neurectomy). Electric current to the sacral nerves (sacral nerve stimulation). Antidepressant medicine. Psychiatric therapy, counseling, or group therapy. Exercise and physical therapy. Meditation and yoga therapy. Acupuncture.  Follow these instructions at home: Only take over-the-counter or prescription medicines as directed by your health care provider. Place a heating pad or hot water bottle on your lower back or abdomen. Do not sleep with the heating pad. Use aerobic exercises, walking, swimming, biking, and other exercises to  help lessen the cramping. Massage to the lower back or abdomen may  help. Stop smoking. Avoid alcohol and caffeine. Contact a health care provider if: Your pain does not get better with medicine. You have pain with sexual intercourse. Your pain increases and is not controlled with medicines. You have abnormal vaginal bleeding with your period. You develop nausea or vomiting with your period that is not controlled with medicine. Get help right away if: You pass out. This information is not intended to replace advice given to you by your health care provider. Make sure you discuss any questions you have with your health care provider. Document Released: 11/04/2005 Document Revised: 04/11/2016 Document Reviewed: 04/22/2013 Elsevier Interactive Patient Education  2017 [employer] Pelvic Pain, Female Pelvic pain is pain in your lower belly (abdomen), below your belly button and between your hips. The pain may start suddenly (acute), keep coming back (recurring), or last a long time (chronic). Pelvic pain that lasts longer than six months is considered chronic. There are many causes of pelvic pain. Sometimes the cause of your pelvic pain is not known. Follow these instructions at home:  Take over-the-counter and prescription medicines only as told by your doctor.  Rest as told by your doctor.  Do not have sex it if hurts.  Keep a journal of your pelvic pain. Write down: ? When the pain started. ? Where the pain is located. ? What seems to make the pain better or worse, such as food or your menstrual cycle. ? Any symptoms you have along with the pain.  Keep all follow-up visits as told by your doctor. This is important. Contact a doctor if:  Medicine does not help your pain.  Your pain comes back.  You have new symptoms.  You have unusual vaginal discharge or bleeding.  You have a fever or chills.  You are having a hard time pooping (constipation).  You have blood in your pee  (urine) or poop (stool).  Your pee smells bad.  You feel weak or lightheaded. Get help right away if:  You have sudden pain that is very bad.  Your pain continues to get worse.  You have very bad pain and also have any of the following symptoms: ? A fever. ? Feeling stick to your stomach (nausea). ? Throwing up (vomiting). ? Being very sweaty.  You pass out (lose consciousness). This information is not intended to replace advice given to you by your health care provider. Make sure you discuss any questions you have with your health care provider. Document Released: 04/22/2008 Document Revised: 11/29/2015 Document Reviewed: 08/25/2015 Elsevier Interactive Patient Education  [employer].

## 2017-12-04 ENCOUNTER — Telehealth: Payer: Self-pay | Admitting: Obstetrics and Gynecology

## 2017-12-04 NOTE — Telephone Encounter (Signed)
Called and left voicemail for patient to call back to be schedule for TVUS and follow up with Dr. Jerene PitchSchuman.

## 2017-12-05 ENCOUNTER — Telehealth: Payer: Self-pay | Admitting: Obstetrics and Gynecology

## 2017-12-05 NOTE — Telephone Encounter (Signed)
Thank you, called and discussed with patient. New orders placed.

## 2017-12-05 NOTE — Telephone Encounter (Signed)
Pt is requesting to have Dr. Jerene PitchSchuman call her about lab work due to cost. Please advise

## 2017-12-05 NOTE — Telephone Encounter (Signed)
Spoke with Patient to due work schedule unable to schedule at this time will call back to be schedule

## 2017-12-05 NOTE — Telephone Encounter (Signed)
Pt aware Dr. Jerene PitchSchuman on call today but forwarding the message. Please advise. Thank you.

## 2017-12-18 NOTE — Progress Notes (Signed)
Patient ID: Christina Case, female   DOB: 10-26-1996, 22 y.o.   MRN: [id number]  Reason for Consult: Menstrual Problem   Referred by Nira Retort  Subjective:     HPI:  Christina Case is a 22 y.o. female she presents today to discuss her irregular menses. Patient reports that she has had irregular periods for several years now. She will go several months between periods, but when her period does come it is heavy and painful. She has been seen before for this issue and tried a progesterone only pill, but this did not help her periods. She would like to solve why she is having irregular periods before she takes medication and does not feel like she has been fully evaluated for secondary amenorrhea/abnormal vaginal bleeding yet.   OB History    Gravida Para Term Preterm AB Living   0 0 0 0 0 0   SAB TAB Ectopic Multiple Live Births   0 0 0 0 0      Gynecologic Hisotry Denies fibroids Denies ovarian cysts Denies STDs  Past Medical History:  Diagnosis Date  . ODD (oppositional defiant disorder)    Family History  Problem Relation Age of Onset  . Cancer Neg Hx   . Diabetes Neg Hx   . Hypertension Neg Hx   . Stroke Neg Hx   . Thyroid disease Neg Hx    Past Surgical History:  Procedure Laterality Date  . NO PAST SURGERIES      Short Social History:  Social History   Tobacco Use  . Smoking status: Never Smoker  . Smokeless tobacco: Never Used  Substance Use Topics  . Alcohol use: Yes    Comment: ocassionally    Allergies  Allergen Reactions  . Hydrocodone Other (See Comments)    dizziness  . Oxycodone Other (See Comments)    dizziness dizziness  . Zofran [Ondansetron Hcl] Nausea And Vomiting    Current Outpatient Medications  Medication Sig Dispense Refill  . ketorolac (TORADOL) 10 MG tablet Take 1 tablet (10 mg total) by mouth every 6 (six) hours as needed. 20 tablet 0   No current facility-administered medications for this visit.     Review of  Systems  Constitutional: Negative for chills, fatigue, fever and unexpected weight change.  HENT: Negative for trouble swallowing.  Eyes: Negative for loss of vision.  Respiratory: Negative for cough, shortness of breath and wheezing.  Cardiovascular: Negative for chest pain, leg swelling, palpitations and syncope.  GI: Negative for abdominal pain, blood in stool, diarrhea, nausea and vomiting.  GU: Negative for difficulty urinating, dysuria, frequency and hematuria.  Musculoskeletal: Negative for back pain, leg pain and joint pain.  Skin: Negative for rash.  Neurological: Negative for dizziness, headaches, light-headedness, numbness and seizures.  Psychiatric: Negative for behavioral problem, confusion, depressed mood and sleep disturbance.        Objective:  Objective   Vitals:   11/21/17 0822  BP: 100/60  Pulse: 73  Weight: 140 lb (63.5 kg)  Height: 5\' 6"  (1.676 m)   Body mass index is 22.6 kg/m.  Physical Exam  Constitutional: She is oriented to person, place, and time. She appears well-developed and well-nourished.  HENT:  Head: Normocephalic and atraumatic.  Eyes: EOM are normal.  Cardiovascular: Normal rate, regular rhythm and normal heart sounds.  Pulmonary/Chest: Effort normal and breath sounds normal.  Genitourinary: Vagina normal and uterus normal. There is no rash, tenderness or lesion on the right labia. There is  no rash, tenderness or lesion on the left labia. Uterus is not enlarged, not fixed and not tender. Cervix exhibits no motion tenderness and no discharge. Right adnexum displays no mass, no tenderness and no fullness. Left adnexum displays no mass, no tenderness and no fullness. No bleeding in the vagina.  Neurological: She is alert and oriented to person, place, and time.  Skin: Skin is warm and dry.  Psychiatric: She has a normal mood and affect. Her behavior is normal. Judgment and thought content normal.  Nursing note and vitals reviewed.        Assessment/Plan:    Secondary amenorrhea and Abnormal uterine bleeding Will order blood work to evaluate patient for causes of secondary amenorrhea: TSH, prolactin, DHEA, and FSH/LH ratio. Will obtain TVUS.  If TSH and prolactin are normal will conduct a progesterone challenge test.    Natale Milchhristanna R Schuman MD [facility] 12/18/17 10:28 PM

## 2017-12-20 ENCOUNTER — Emergency Department
Admission: EM | Admit: 2017-12-20 | Discharge: 2017-12-20 | Disposition: A | Discharge status: Home / Self Care | Payer: BLUE CROSS/BLUE SHIELD | Attending: Emergency Medicine | Admitting: Emergency Medicine

## 2017-12-20 ENCOUNTER — Other Ambulatory Visit: Payer: Self-pay

## 2017-12-20 ENCOUNTER — Emergency Department: Payer: BLUE CROSS/BLUE SHIELD

## 2017-12-20 DIAGNOSIS — R569 Unspecified convulsions: Secondary | ICD-10-CM | POA: Insufficient documentation

## 2017-12-20 HISTORY — DX: Migraine, unspecified, not intractable, without status migrainosus: G43.909

## 2017-12-20 LAB — COMPREHENSIVE METABOLIC PANEL
ALK PHOS: 115 U/L (ref 38–126)
ALT: 13 U/L — AB (ref 14–54)
AST: 17 U/L (ref 15–41)
Albumin: 5.3 g/dL — ABNORMAL HIGH (ref 3.5–5.0)
Anion gap: 9 (ref 5–15)
BILIRUBIN TOTAL: 0.5 mg/dL (ref 0.3–1.2)
BUN: 11 mg/dL (ref 6–20)
CO2: 26 mmol/L (ref 22–32)
CREATININE: 0.71 mg/dL (ref 0.44–1.00)
Calcium: 9.7 mg/dL (ref 8.9–10.3)
Chloride: 105 mmol/L (ref 101–111)
GFR calc Af Amer: >60 mL/min (ref >60)
GFR calc non Af Amer: >60 mL/min (ref >60)
GLUCOSE: 104 mg/dL — AB (ref 65–99)
POTASSIUM: 3.6 mmol/L (ref 3.5–5.1)
Sodium: 140 mmol/L (ref 135–145)
TOTAL PROTEIN: 8.9 g/dL — AB (ref 6.5–8.1)

## 2017-12-20 LAB — CBC WITH DIFFERENTIAL/PLATELET
BASOS PCT: 1 %
Basophils Absolute: 0 10*3/uL (ref 0–0.1)
Eosinophils Absolute: 0 10*3/uL (ref 0–0.7)
Eosinophils Relative: 0 %
HEMATOCRIT: 43.3 % (ref 35.0–47.0)
Hemoglobin: 14.3 g/dL (ref 12.0–16.0)
Lymphocytes Relative: 14 %
Lymphs Abs: 1.4 10*3/uL (ref 1.0–3.6)
MCH: 29.4 pg (ref 26.0–34.0)
MCHC: 33.1 g/dL (ref 32.0–36.0)
MCV: 88.8 fL (ref 80.0–100.0)
MONO ABS: 0.5 10*3/uL (ref 0.2–0.9)
MONOS PCT: 5 %
NEUTROS ABS: 8.2 10*3/uL — AB (ref 1.4–6.5)
Neutrophils Relative %: 80 %
Platelets: 353 10*3/uL (ref 150–440)
RBC: 4.88 MIL/uL (ref 3.80–5.20)
RDW: 12.8 % (ref 11.5–14.5)
WBC: 10.2 10*3/uL (ref 3.6–11.0)

## 2017-12-20 LAB — HCG, QUANTITATIVE, PREGNANCY: hCG, Beta Chain, Quant, S: <1 m[IU]/mL (ref <5)

## 2017-12-20 LAB — ETHANOL: Alcohol, Ethyl (B): <10 mg/dL (ref <10)

## 2017-12-20 MED ORDER — PROMETHAZINE HCL 25 MG/ML IJ SOLN
12.5000 mg | Freq: Once | INTRAMUSCULAR | Status: AC
  Administered 2017-12-20: 12.5 mg via INTRAVENOUS
  Filled 2017-12-20: qty 1

## 2017-12-20 MED ORDER — LEVETIRACETAM 500 MG PO TABS: 500.0000 mg | ORAL_TABLET | Freq: Two times a day (BID) | ORAL | 0 refills | Status: DC

## 2017-12-20 MED ORDER — LEVETIRACETAM 500 MG PO TABS
500.0000 mg | ORAL_TABLET | Freq: Once | ORAL | Status: DC
  Filled 2017-12-20: qty 1

## 2017-12-20 MED ORDER — ACETAMINOPHEN 500 MG PO TABS
1000.0000 mg | ORAL_TABLET | Freq: Once | ORAL | Status: DC
  Filled 2017-12-20: qty 2

## 2017-12-20 MED ORDER — IBUPROFEN 600 MG PO TABS
600.0000 mg | ORAL_TABLET | Freq: Once | ORAL | Status: DC
  Filled 2017-12-20: qty 1

## 2017-12-20 MED ORDER — SODIUM CHLORIDE 0.9 % IV BOLUS (SEPSIS)
1000.0000 mL | Freq: Once | INTRAVENOUS | Status: AC
  Administered 2017-12-20: 1000 mL via INTRAVENOUS

## 2017-12-20 MED ORDER — PROMETHAZINE HCL 25 MG PO TABS: 25.0000 mg | ORAL_TABLET | Freq: Four times a day (QID) | ORAL | 0 refills | Status: DC | PRN

## 2017-12-20 MED ORDER — LEVETIRACETAM 500 MG PO TABS
500.0000 mg | ORAL_TABLET | Freq: Once | ORAL | Status: AC
  Administered 2017-12-20: 500 mg via ORAL
  Filled 2017-12-20: qty 1

## 2017-12-20 MED ORDER — PROMETHAZINE HCL 25 MG PO TABS
ORAL_TABLET | ORAL | Status: AC
  Filled 2017-12-20: qty 1

## 2017-12-20 MED ORDER — PROMETHAZINE HCL 25 MG PO TABS
25.0000 mg | ORAL_TABLET | Freq: Once | ORAL | Status: AC
  Administered 2017-12-20: 25 mg via ORAL

## 2017-12-20 NOTE — ED Provider Notes (Signed)
-----------------------------------------   7:21 AM on 12/20/2017 -----------------------------------------  Patient care assumed from Dr. Lamont Snowballifenbark.  Patient was to be discharged however she has vomited in the emergency department after taking her Keppra and ibuprofen.  Patient still has an IV, we will dose IV Phenergan and re-dose Keppra.  We will monitor in the emergency department the patient feels improved we will discharge home with PCP follow-up.  ----------------------------------------- 8:32 AM on 12/20/2017 -----------------------------------------  Patient states nausea is much improved does feel somewhat lightheaded probably due to the Phenergan use.  Patient states she has been nauseated for the past several days unable to keep down much by mouth.  Patient's workup is largely normal.  Pregnancy test is negative.  We will discharge with Keppra as well as Phenergan.  Patient is to call her doctor and neurologist on Monday.  I discussed return precautions for any further seizure activity.   Minna AntisPaduchowski, Jakim Drapeau, MD 12/20/17 [phone]

## 2017-12-20 NOTE — ED Notes (Signed)
Patient c/o headache and nausea

## 2017-12-20 NOTE — ED Triage Notes (Signed)
Patient reports she has hx of seizures. Patient reports that she has only ever been on Gabapentin for seizure, denies other medications.
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
## 2017-12-20 NOTE — ED Notes (Signed)
Pt has not vomited since IV meds. NAD. Ok for discharge
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
## 2017-12-20 NOTE — ED Triage Notes (Signed)
Patient reports general malaise beginning Monday. Patient's friend reports since Monday patient has been "zoning out" in the middle of conversations.

## 2017-12-20 NOTE — ED Notes (Signed)
Took motrin and half keppra and vomited immediately after. Dr Lenard Lancepaduchowski notified

## 2017-12-20 NOTE — ED Notes (Signed)
Will discharge after able to take PO medications for seizures.

## 2017-12-20 NOTE — Discharge Instructions (Signed)
Fortunately today your head CT, your blood work, and your EKG were reassuring.  Please begin taking your seizure medication as prescribed and follow-up with your neurologist within 1 month for reevaluation.  Return to the emergency department sooner for any concerns whatsoever.  It was a pleasure to take care of you today, and thank you for coming to our emergency department.  If you have any questions or concerns before leaving please ask the nurse to grab me and I'm more than happy to go through your aftercare instructions again.  If you were prescribed any opioid pain medication today such as Norco, Vicodin, Percocet, morphine, hydrocodone, or oxycodone please make sure you do not drive when you are taking this medication as it can alter your ability to drive safely.  If you have any concerns once you are home that you are not improving or are in fact getting worse before you can make it to your follow-up appointment, please do not hesitate to call [phone] and come back for further evaluation.  Merrily Brittle, MD  Results for orders placed or performed during the hospital encounter of 12/20/17  Comprehensive metabolic panel  Result Value Ref Range   Sodium 140 135 - 145 mmol/L   Potassium 3.6 3.5 - 5.1 mmol/L   Chloride 105 101 - 111 mmol/L   CO2 26 22 - 32 mmol/L   Glucose, Bld 104 (H) 65 - 99 mg/dL   BUN 11 6 - 20 mg/dL   Creatinine, Ser 1.61 0.44 - 1.00 mg/dL   Calcium 9.7 8.9 - 09.6 mg/dL   Total Protein 8.9 (H) 6.5 - 8.1 g/dL   Albumin 5.3 (H) 3.5 - 5.0 g/dL   AST 17 15 - 41 U/L   ALT 13 (L) 14 - 54 U/L   Alkaline Phosphatase 115 38 - 126 U/L   Total Bilirubin 0.5 0.3 - 1.2 mg/dL   GFR calc non Af Amer >60 >60 mL/min   GFR calc Af Amer >60 >60 mL/min   Anion gap 9 5 - 15  Ethanol  Result Value Ref Range   Alcohol, Ethyl (B) <10 <10 mg/dL  CBC with Differential  Result Value Ref Range   WBC 10.2 3.6 - 11.0 K/uL   RBC 4.88 3.80 - 5.20 MIL/uL   Hemoglobin 14.3 12.0 - 16.0 g/dL     HCT 04.5 40.9 - 81.1 %   MCV 88.8 80.0 - 100.0 fL   MCH 29.4 26.0 - 34.0 pg   MCHC 33.1 32.0 - 36.0 g/dL   RDW 91.4 78.2 - 95.6 %   Platelets 353 150 - 440 K/uL   Neutrophils Relative % 80 %   Neutro Abs 8.2 (H) 1.4 - 6.5 K/uL   Lymphocytes Relative 14 %   Lymphs Abs 1.4 1.0 - 3.6 K/uL   Monocytes Relative 5 %   Monocytes Absolute 0.5 0.2 - 0.9 K/uL   Eosinophils Relative 0 %   Eosinophils Absolute 0.0 0 - 0.7 K/uL   Basophils Relative 1 %   Basophils Absolute 0.0 0 - 0.1 K/uL  hCG, quantitative, pregnancy  Result Value Ref Range   hCG, Beta Chain, Quant, S <1 <5 mIU/mL   Ct Head Wo Contrast  Result Date: 12/20/2017 CLINICAL DATA:  General malaise for 1 week, altered mental status. Assess for first time seizure. EXAM: CT HEAD WITHOUT CONTRAST TECHNIQUE: Contiguous axial images were obtained from the base of the skull through the vertex without intravenous contrast. COMPARISON:  MRI of the head August 27, 2013 FINDINGS: BRAIN: No intraparenchymal hemorrhage, mass effect nor midline shift. Cavum septum pellucidum. The ventricles and sulci are normal. No acute large vascular territory infarcts. No abnormal extra-axial fluid collections. Basal cisterns are patent. VASCULAR: Unremarkable. SKULL/SOFT TISSUES: No skull fracture. No significant soft tissue swelling. ORBITS/SINUSES: The included ocular globes and orbital contents are normal.The mastoid aircells and included paranasal sinuses are well-aerated. OTHER: None. IMPRESSION: Normal noncontrast CT HEAD. Electronically Signed   By: Awilda Metroourtnay  Bloomer M.D.   On: 12/20/2017 04:53

## 2017-12-20 NOTE — ED Notes (Signed)
Patient transported to CT 

## 2018-01-17 ENCOUNTER — Other Ambulatory Visit: Payer: Self-pay

## 2018-01-17 ENCOUNTER — Emergency Department
Admission: EM | Admit: 2018-01-17 | Discharge: 2018-01-17 | Disposition: A | Discharge status: Home / Self Care | Payer: BLUE CROSS/BLUE SHIELD | Attending: Emergency Medicine | Admitting: Emergency Medicine

## 2018-01-17 DIAGNOSIS — R519 Headache, unspecified: Secondary | ICD-10-CM

## 2018-01-17 DIAGNOSIS — R51 Headache: Secondary | ICD-10-CM | POA: Insufficient documentation

## 2018-01-17 DIAGNOSIS — Z79899 Other long term (current) drug therapy: Secondary | ICD-10-CM | POA: Diagnosis not present

## 2018-01-17 DIAGNOSIS — R42 Dizziness and giddiness: Secondary | ICD-10-CM | POA: Diagnosis present

## 2018-01-17 DIAGNOSIS — E860 Dehydration: Secondary | ICD-10-CM | POA: Insufficient documentation

## 2018-01-17 DIAGNOSIS — F17210 Nicotine dependence, cigarettes, uncomplicated: Secondary | ICD-10-CM | POA: Insufficient documentation

## 2018-01-17 LAB — BASIC METABOLIC PANEL
Anion gap: 10 (ref 5–15)
BUN: 17 mg/dL (ref 6–20)
CO2: 25 mmol/L (ref 22–32)
Calcium: 9.5 mg/dL (ref 8.9–10.3)
Chloride: 105 mmol/L (ref 101–111)
Creatinine, Ser: 0.61 mg/dL (ref 0.44–1.00)
GFR calc Af Amer: >60 mL/min (ref >60)
GLUCOSE: 86 mg/dL (ref 65–99)
POTASSIUM: 3.5 mmol/L (ref 3.5–5.1)
SODIUM: 140 mmol/L (ref 135–145)

## 2018-01-17 LAB — URINALYSIS, COMPLETE (UACMP) WITH MICROSCOPIC
Bilirubin Urine: NEGATIVE
Glucose, UA: NEGATIVE mg/dL
HGB URINE DIPSTICK: NEGATIVE
Ketones, ur: 5 mg/dL — AB
Leukocytes, UA: NEGATIVE
NITRITE: NEGATIVE
PH: 6 (ref 5.0–8.0)
Protein, ur: NEGATIVE mg/dL
SPECIFIC GRAVITY, URINE: 1.023 (ref 1.005–1.030)

## 2018-01-17 LAB — CBC
HEMATOCRIT: 42.7 % (ref 35.0–47.0)
Hemoglobin: 14.4 g/dL (ref 12.0–16.0)
MCH: 30.2 pg (ref 26.0–34.0)
MCHC: 33.8 g/dL (ref 32.0–36.0)
MCV: 89.3 fL (ref 80.0–100.0)
PLATELETS: 321 10*3/uL (ref 150–440)
RBC: 4.78 MIL/uL (ref 3.80–5.20)
RDW: 13.3 % (ref 11.5–14.5)
WBC: 10.1 10*3/uL (ref 3.6–11.0)

## 2018-01-17 LAB — POCT PREGNANCY, URINE: PREG TEST UR: NEGATIVE

## 2018-01-17 MED ORDER — KETOROLAC TROMETHAMINE 30 MG/ML IJ SOLN
15.0000 mg | Freq: Once | INTRAMUSCULAR | Status: AC
  Administered 2018-01-17: 15 mg via INTRAMUSCULAR

## 2018-01-17 MED ORDER — DIPHENHYDRAMINE HCL 25 MG PO CAPS
25.0000 mg | ORAL_CAPSULE | Freq: Once | ORAL | Status: AC
  Administered 2018-01-17: 25 mg via ORAL
  Filled 2018-01-17: qty 1

## 2018-01-17 MED ORDER — KETOROLAC TROMETHAMINE 30 MG/ML IJ SOLN
INTRAMUSCULAR | Status: AC
  Administered 2018-01-17: 15 mg via INTRAMUSCULAR
  Filled 2018-01-17: qty 1

## 2018-01-17 MED ORDER — METOCLOPRAMIDE HCL 10 MG PO TABS
10.0000 mg | ORAL_TABLET | Freq: Once | ORAL | Status: AC
  Administered 2018-01-17: 10 mg via ORAL
  Filled 2018-01-17: qty 1

## 2018-01-17 MED ORDER — ALUM & MAG HYDROXIDE-SIMETH 200-200-20 MG/5ML PO SUSP
15.0000 mL | Freq: Once | ORAL | Status: AC
  Administered 2018-01-17: 15 mL via ORAL
  Filled 2018-01-17: qty 30

## 2018-01-17 NOTE — ED Notes (Signed)
Pt given graham crackers, peanut butter, and apple juice for PO challenge.

## 2018-01-17 NOTE — ED Provider Notes (Signed)
[facility] Emergency Department Provider Note  ____________________________________________  Time seen: Approximately 8:06 PM  I have reviewed the triage vital signs and the nursing notes.   HISTORY  Chief Complaint Dizziness    HPI Christina Case is a 22 y.o. female sent [facility] the ED from [facility] for possible serotonin syndrome. Patient has a complicated behavioral history. She was started on Effexor about a week ago, and his reported sweating and restlessness and muscle spasticity. No fever or hallucinations or confusion. At [facility], pupils were dilated, patient was hyperreflexic on exam, so she was sent to the ED for further evaluation. On arrival to the treatment room, she feels much better.  She does also report recurrence of the long-standing headache. Also nausea and poor oral intake for the past 3 days and feeling dehydrated and lightheaded when she stands up. She is under the care of primary care and neurology.     Past Medical History:  Diagnosis Date  . Migraine   . ODD (oppositional defiant disorder)      Patient Active Problem List   Diagnosis Date Noted  . Epigastric pain 05/22/2017  . Other dysphagia 05/22/2017  . 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 PTSD (post-traumatic stress disorder) 07/15/2016  . OCD (obsessive compulsive disorder) 07/15/2016  . Suicidal ideation 07/15/2016  . 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 NO PAST SURGERIES       Prior to Admission medications   Medication Sig Start Date End Date Taking? Authorizing Provider  levETIRAcetam (KEPPRA) 500 MG tablet Take 1 tablet (500 mg total) by mouth 2 (two) times daily. 12/20/17  Yes Merrily Brittleifenbark, Neil, MD  methylPREDNISolone (MEDROL DOSEPAK) 4 MG TBPK tablet Take 1-6 tablets by mouth See admin instructions. Take 6 tablets on day 1 as directed on  package and decrease by 1 tablet each day for a total of 6 days. 01/14/18  Yes [provider]  nortriptyline (PAMELOR) 10 MG capsule Take 20 mg by mouth at bedtime. 12/29/17  Yes [provider]  venlafaxine (EFFEXOR) 75 MG tablet Take 37.5-75 mg by mouth daily. Take 37.5 mg daily for one week then 75 mg daily. 01/13/18  Yes [provider]  ketorolac (TORADOL) 10 MG tablet Take 1 tablet (10 mg total) by mouth every 6 (six) hours as needed. Patient not taking: Reported on 01/17/2018 11/02/17   Darci CurrentBrown,  N, MD  promethazine (PHENERGAN) 25 MG tablet Take 1 tablet (25 mg total) by mouth every 6 (six) hours as needed for nausea or vomiting. Patient not taking: Reported on 01/17/2018 12/20/17   Minna AntisPaduchowski, Kevin, MD     Allergies Hydrocodone; Oxycodone; and Zofran [ondansetron hcl]   Family History  Problem Relation Age of Onset  . Cancer Neg Hx   . Diabetes Neg Hx   . Hypertension Neg Hx   . Stroke Neg Hx   . Thyroid disease Neg Hx     Social History Social History   Tobacco Use  . Smoking status: Current Every Day Smoker  . Smokeless tobacco: Never Used  Substance Use Topics  . Alcohol use: Yes    Comment: ocassionally  . Drug use: No    Review of Systems  Constitutional:   No fever or chills.  ENT:   No sore throat. No rhinorrhea. Cardiovascular:   No chest pain or syncope. Respiratory:   No dyspnea or cough. Gastrointestinal:  Positive for abdominal pain and vomiting..  Musculoskeletal:  Positive for muscle spasms  All other systems reviewed and are negative except as documented above in ROS and HPI.  ____________________________________________   PHYSICAL EXAM:  VITAL SIGNS: ED Triage Vitals  Enc Vitals Group     BP 01/17/18 1216 119/73     Pulse Rate 01/17/18 1216 82     Resp 01/17/18 1216 16     Temp 01/17/18 1216 98.3 F (36.8 C)     Temp Source 01/17/18 1216 Oral     SpO2 01/17/18 1216 100 %     Weight 01/17/18 1217 138 lb  (62.6 kg)     Height 01/17/18 1217 5\' 6"  (1.676 m)     Head Circumference --      Peak Flow --      Pain Score 01/17/18 1216 7     Pain Loc --      Pain Edu? --      Excl. in GC? --     Vital signs reviewed, nursing assessments reviewed.   Constitutional:   Alert and oriented. Well appearing and in no distress. Eyes:   No scleral icterus.  EOMI. No nystagmus. No conjunctival pallor. PERRL, 6 mm, briskly reactive.Marland Kitchen ENT   Head:   Normocephalic and atraumatic.   Nose:   No congestion/rhinnorhea.    Mouth/Throat:   MMM, no pharyngeal erythema. No peritonsillar mass.    Neck:   No meningismus. Full ROM. Hematological/Lymphatic/Immunilogical:   No cervical lymphadenopathy. Cardiovascular:   RRR. Symmetric bilateral radial and DP pulses.  No murmurs.  Respiratory:   Normal respiratory effort without tachypnea/retractions. Breath sounds are clear and equal bilaterally. No wheezes/rales/rhonchi. Gastrointestinal:   Soft and nontender. Non distended. There is no CVA tenderness.  No rebound, rigidity, or guarding. Genitourinary:   deferred Musculoskeletal:   Normal range of motion in all extremities. No joint effusions.  No lower extremity tenderness.  No edema. Neurologic:   Normal speech and language.  Motor grossly intact. 3+ patellar reflexes in lower extremities without clonus. Normal muscle tone. No rigidity. 2+ biceps reflexes. No acute focal neurologic deficits are appreciated.  Skin:    Skin is warm, dry and intact. No rash noted.  No petechiae, purpura, or bullae.  ____________________________________________    LABS (pertinent positives/negatives) (all labs ordered are listed, but only abnormal results are displayed) Labs Reviewed  URINALYSIS, COMPLETE (UACMP) WITH MICROSCOPIC - Abnormal; Notable for the following components:      Result Value   Color, Urine YELLOW (*)    APPearance CLEAR (*)    Ketones, ur 5 (*)    Bacteria, UA RARE (*)    Squamous Epithelial  / LPF 0-5 (*)    All other components within normal limits  BASIC METABOLIC PANEL  CBC  POC URINE PREG, ED  POCT PREGNANCY, URINE   ____________________________________________   EKG  Interpreted by me Normal sinus rhythm rate of 80, normal axis and intervals. Normal QRS ST segments and T waves.  ____________________________________________    RADIOLOGY  No results found.  ____________________________________________   PROCEDURES Procedures  ____________________________________________    CLINICAL IMPRESSION / ASSESSMENT AND PLAN / ED COURSE  Pertinent labs & imaging results that were available during my care of the patient were reviewed by me and considered in my medical decision making (see chart for details).     Clinical Course as of Jan 17 2005  Sat Jan 17, 2018  1635 Presentation currently not c/w serotonin syndrome. VSS.  Has mild hyperrflexia without clonus. No muscular spasticity/rigidity.  Nl tone. Possible anticholinergic syndrome.  Sx have significantly improved in the last few hours. Will give antiemetics and PO trial given evident dehydration.   [PS]  1756 Tolerating oral intake, fluids and crackers. Stable for outpatient follow up.   [PS]    Clinical Course User Index [PS] Sharman Cheek, MD     ----------------------------------------- 8:18 PM on 01/17/2018 -----------------------------------------  Feels much better, wants to go home. Vitals remain normal. Has continued improving. Doubt any lingering effects from Effexor such as serotonin syndrome or anticholinergic syndrome.  Considering the patient's symptoms, medical history, and physical examination today, I have low suspicion for cholecystitis or biliary pathology, pancreatitis, perforation or bowel obstruction, hernia, intra-abdominal abscess, AAA or dissection, volvulus or intussusception, mesenteric ischemia, or appendicitis.  Discontinue Effexor. Continue Keppra wean. Has Phenergan  at home for nausea. Stable for outpatient follow-up. Labs reveal hemoconcentration and concentrated urine consistent with dehydration, no other significant findings. Pregnancy negative.   ____________________________________________   FINAL CLINICAL IMPRESSION(S) / ED DIAGNOSES    Final diagnoses:  Nonintractable headache, unspecified chronicity pattern, unspecified headache type  Dehydration     ED Discharge Orders    None      Portions of this note were generated with dragon dictation software. Dictation errors may occur despite best attempts at proofreading.    Sharman Cheek, MD 01/17/18 2021

## 2018-01-17 NOTE — ED Triage Notes (Signed)
Pt arrives via WC from [facility] with concerns of dizziness, jitteriness, N&V, muscle twitching. Alert, oriented, no distress noted. States dizziness feels like she is going to pass out, not room spinning. States drinking water and juice but not eating anything. Denies diarrhea and fever. C/o HA but denies sensitivity to light, sound, or movement.

## 2018-01-26 ENCOUNTER — Emergency Department: Payer: BLUE CROSS/BLUE SHIELD

## 2018-01-26 ENCOUNTER — Other Ambulatory Visit: Payer: Self-pay

## 2018-01-26 ENCOUNTER — Emergency Department
Admission: EM | Admit: 2018-01-26 | Discharge: 2018-01-27 | Disposition: A | Discharge status: Home / Self Care | Payer: BLUE CROSS/BLUE SHIELD | Attending: Emergency Medicine | Admitting: Emergency Medicine

## 2018-01-26 ENCOUNTER — Encounter: Payer: Self-pay | Admitting: Emergency Medicine

## 2018-01-26 DIAGNOSIS — Z79899 Other long term (current) drug therapy: Secondary | ICD-10-CM | POA: Diagnosis not present

## 2018-01-26 DIAGNOSIS — R42 Dizziness and giddiness: Secondary | ICD-10-CM

## 2018-01-26 DIAGNOSIS — F17200 Nicotine dependence, unspecified, uncomplicated: Secondary | ICD-10-CM | POA: Insufficient documentation

## 2018-01-26 DIAGNOSIS — R413 Other amnesia: Secondary | ICD-10-CM

## 2018-01-26 DIAGNOSIS — R51 Headache: Secondary | ICD-10-CM | POA: Insufficient documentation

## 2018-01-26 DIAGNOSIS — R531 Weakness: Secondary | ICD-10-CM

## 2018-01-26 DIAGNOSIS — R519 Headache, unspecified: Secondary | ICD-10-CM

## 2018-01-26 MED ORDER — METHYLPREDNISOLONE SODIUM SUCC 125 MG IJ SOLR
125.0000 mg | Freq: Once | INTRAMUSCULAR | Status: AC
  Administered 2018-01-27: 125 mg via INTRAVENOUS
  Filled 2018-01-26: qty 2

## 2018-01-26 MED ORDER — SODIUM CHLORIDE 0.9 % IV BOLUS (SEPSIS)
1000.0000 mL | Freq: Once | INTRAVENOUS | Status: AC
  Administered 2018-01-27: 1000 mL via INTRAVENOUS

## 2018-01-26 NOTE — ED Triage Notes (Signed)
First Nurse Note:  [facility].  Patient

## 2018-01-26 NOTE — ED Provider Notes (Signed)
[facility] Emergency Department Provider Note   ____________________________________________   First MD Initiated Contact with Patient 01/26/18 2258     (approximate)  I have reviewed the triage vital signs and the nursing notes.   HISTORY  Chief Complaint Headache; Fatigue; Memory Loss; and Weakness    HPI Christina Case is a 22 y.o. female who comes into the hospital today with multiple different neurologic symptoms.  The patient has been having headaches, seizure activity, increased forgetfulness and inability to complete her day.  The patient states that she has been trying different medications and seeing neurology.  She states that she went to urgent care today because she felt that the symptoms were coming back and getting worse but she states that they did not have any answers for her.  The patient contacted her neurologist office and they told her to come in if she felt that she had an emergency medical condition.  The patient reports that the symptoms are affecting her school performance and affecting her work.  She states that she is unable to work and perform.  She reports that she had an EEG 2 weeks ago and has a longer one scheduled for later in the month.  The patient states that they put her on Keppra but took her off and then started her on Effexor nortriptyline and more recently Seroquel.  The patient states that she did a steroid taper for her headache and it did seem to help her symptoms for a short time but now they seem to be coming back.  The patient states that she does currently have a headache.  The patient rates her pain a 7 out of 10 in intensity.  The patient states that she is frustrated because she is unable to be the way she has been in the past.  Past Medical History:  Diagnosis Date  . Migraine   . ODD (oppositional defiant disorder)     Patient Active Problem List   Diagnosis Date Noted  . Epigastric pain 05/22/2017  . Other  dysphagia 05/22/2017  . 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 PTSD (post-traumatic stress disorder) 07/15/2016  . OCD (obsessive compulsive disorder) 07/15/2016  . Suicidal ideation 07/15/2016  . 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 NO PAST SURGERIES      Prior to Admission medications   Medication Sig Start Date End Date Taking? Authorizing Provider  ketorolac (TORADOL) 10 MG tablet Take 1 tablet (10 mg total) by mouth every 6 (six) hours as needed. Patient not taking: Reported on 01/17/2018 11/02/17   Darci CurrentBrown, Brinnon N, MD  levETIRAcetam (KEPPRA) 500 MG tablet Take 1 tablet (500 mg total) by mouth 2 (two) times daily. 12/20/17   Merrily Brittleifenbark, Neil, MD  methylPREDNISolone (MEDROL DOSEPAK) 4 MG TBPK tablet Take 1-6 tablets by mouth See admin instructions. Take 6 tablets on day 1 as directed on package and decrease by 1 tablet each day for a total of 6 days. 01/14/18   [provider]  nortriptyline (PAMELOR) 10 MG capsule Take 20 mg by mouth at bedtime. 12/29/17   [provider]  promethazine (PHENERGAN) 25 MG tablet Take 1 tablet (25 mg total) by mouth every 6 (six) hours as needed for nausea or vomiting. Patient not taking: Reported on 01/17/2018 12/20/17   Minna AntisPaduchowski, Kevin, MD  venlafaxine [facility](EFFEXOR) 75 MG tablet Take 37.5-75 mg by mouth daily. Take 37.5  mg daily for one week then 75 mg daily. 01/13/18   [provider]    Allergies Hydrocodone; Oxycodone; and Zofran [ondansetron hcl]  Family History  Problem Relation Age of Onset  . Cancer Neg Hx   . Diabetes Neg Hx   . Hypertension Neg Hx   . Stroke Neg Hx   . Thyroid disease Neg Hx     Social History Social History   Tobacco Use  . Smoking status: Current Every Day Smoker  . Smokeless tobacco: Never Used  Substance Use Topics  . Alcohol use: Yes    Comment: ocassionally  . Drug use: No    Review  of Systems  Constitutional: No fever/chills Eyes: No visual changes. ENT: No sore throat. Cardiovascular: Denies chest pain. Respiratory: Denies shortness of breath. Gastrointestinal: No abdominal pain.  No nausea, no vomiting.  No diarrhea.  No constipation. Genitourinary: Negative for dysuria. Musculoskeletal: Negative for back pain. Skin: Negative for rash. Neurological: headache, seizure like activity   ____________________________________________   PHYSICAL EXAM:  VITAL SIGNS: ED Triage Vitals  Enc Vitals Group     BP 01/26/18 1928 (!) 139/91     Pulse Rate 01/26/18 1928 98     Resp 01/26/18 1928 16     Temp 01/26/18 1928 98 F (36.7 C)     Temp Source 01/26/18 1928 Oral     SpO2 01/26/18 1928 100 %     Weight 01/26/18 1929 140 lb (63.5 kg)     Height 01/26/18 1929 5\' 6"  (1.676 m)     Head Circumference --      Peak Flow --      Pain Score 01/26/18 1928 7     Pain Loc --      Pain Edu? --      Excl. in GC? --     Constitutional: Alert and oriented. Well appearing and in mild distress. Eyes: Conjunctivae are normal. PERRL. EOMI. Head: Atraumatic. Nose: No congestion/rhinnorhea. Mouth/Throat: Mucous membranes are moist.  Oropharynx non-erythematous. Cardiovascular: Normal rate, regular rhythm. Grossly normal heart sounds.  Good peripheral circulation. Respiratory: Normal respiratory effort.  No retractions. Lungs CTAB. Gastrointestinal: Soft and nontender. No distention.  Positive bowel sounds Musculoskeletal: No lower extremity tenderness nor edema.   Neurologic:  Normal speech and language. Cranial nerves II through XII are grossly intact with no focal motor neuro deficit Skin:  Skin is warm, dry and intact. Psychiatric: Mood and affect are normal.   ____________________________________________   LABS (all labs ordered are listed, but only abnormal results are displayed)  Labs Reviewed  CBC - Abnormal; Notable for the following components:      Result  Value   WBC 12.0 (*)    All other components within normal limits  BASIC METABOLIC PANEL   ____________________________________________  EKG  None ____________________________________________  RADIOLOGY  ED MD interpretation: MRI brain: Normal stable MRI of the brain no cause of seizure identified  Official radiology report(s): Mr Brain Wo Contrast  Result Date: 01/27/2018 CLINICAL DATA:  22 y/o F; chronic memory loss, headaches, weakness, dizzy spells, patient reported seizures. EXAM: MRI HEAD WITHOUT CONTRAST TECHNIQUE: Multiplanar, multiecho pulse sequences of the brain and surrounding structures were obtained without intravenous contrast. COMPARISON:  12/20/2017 CT head.  08/27/2013 MRI head. FINDINGS: Brain: No acute infarction, hemorrhage, hydrocephalus, extra-axial collection or mass lesion. Morphologically normal pituitary gland, complete corpus callosum, complete vermis. Cavum septum pellucidum. No disorder cortical formation, gray matter heterotopia, or cortical dysplasia identified. Hippocampi are symmetric in  size and signal. Vascular: Normal flow voids. Skull and upper cervical spine: Normal marrow signal. Sinuses/Orbits: Negative. Other: None. IMPRESSION: Normal stable MRI of the brain. No structural cause of seizure identified. Electronically Signed   By: Mitzi Hansen M.D.   On: 01/27/2018 00:45    ____________________________________________   PROCEDURES  Procedure(s) performed: None  Procedures  Critical Care performed: No  ____________________________________________   INITIAL IMPRESSION / ASSESSMENT AND PLAN / ED COURSE  As part of my medical decision making, I reviewed the following data within the electronic MEDICAL RECORD NUMBER Notes from prior ED visits and [place] Controlled Substance Database   This is a 22 year old female who comes into the hospital today with multiple neurologic symptoms.  The patient has been having the symptoms for the past  month and a half.  I did attempt to set expectations when I initially arrived in the room.  The patient has been seeing neurology for the seizures, memory loss, headaches and weakness.  The patient has been on 4 different medications through neurology and has been still having progressively worsening symptoms.  I did give the patient a liter of normal saline as well as some Solu-Medrol for her headache.  I sent the patient for an MRI as she had not yet received 1.  The patient's MRI is negative.  I also check some blood work and aside from the white count of 12 the patient's blood work is unremarkable.  I did explain to the patient that everything should look unremarkable she would need to follow back up with neurology for further evaluation of the symptoms.  The patient will be discharged to follow-up.      ____________________________________________   FINAL CLINICAL IMPRESSION(S) / ED DIAGNOSES  Final diagnoses:  Acute nonintractable headache, unspecified headache type  Weakness  Dizziness  Memory loss     ED Discharge Orders    None       Note:  This document was prepared using Dragon voice recognition software and may include unintentional dictation errors.    Rebecka Apley, MD 01/27/18 [phone]

## 2018-01-26 NOTE — ED Notes (Signed)
Pt states she has been having seizures. Pt says she has bitten her tongue. Pt was on keppra but d/c. Pt told neuro but no new

## 2018-01-26 NOTE — ED Triage Notes (Signed)
Pt arrived to the ED for complaints of multiple neurological symptoms. Pt states that for about 40 days she has been experiencing memory loss, headaches, weakness, dizzy spells and what she believes are seizures. Pt repots that she has seen a neurologist and have done multiple test that have been inconclusive. Pt reports poor appetite and not eating well with pain secondary to swallowing. Pt is AOx4 in no apparent distress.

## 2018-01-27 LAB — CBC
HEMATOCRIT: 42.5 % (ref 35.0–47.0)
Hemoglobin: 14.1 g/dL (ref 12.0–16.0)
MCH: 29.8 pg (ref 26.0–34.0)
MCHC: 33.2 g/dL (ref 32.0–36.0)
MCV: 89.8 fL (ref 80.0–100.0)
PLATELETS: 356 10*3/uL (ref 150–440)
RBC: 4.73 MIL/uL (ref 3.80–5.20)
RDW: 13.7 % (ref 11.5–14.5)
WBC: 12 10*3/uL — AB (ref 3.6–11.0)

## 2018-01-27 LAB — BASIC METABOLIC PANEL
ANION GAP: 10 (ref 5–15)
BUN: 9 mg/dL (ref 6–20)
CO2: 23 mmol/L (ref 22–32)
Calcium: 9.1 mg/dL (ref 8.9–10.3)
Chloride: 107 mmol/L (ref 101–111)
Creatinine, Ser: 0.59 mg/dL (ref 0.44–1.00)
GLUCOSE: 81 mg/dL (ref 65–99)
POTASSIUM: 3.7 mmol/L (ref 3.5–5.1)
Sodium: 140 mmol/L (ref 135–145)

## 2018-01-27 MED ORDER — BUTALBITAL-APAP-CAFFEINE 50-325-40 MG PO TABS: 1.0000 | ORAL_TABLET | Freq: Four times a day (QID) | ORAL | 0 refills | Status: AC | PRN

## 2018-01-27 MED ORDER — BUTALBITAL-APAP-CAFFEINE 50-325-40 MG PO TABS
1.0000 | ORAL_TABLET | Freq: Once | ORAL | Status: DC
  Filled 2018-01-27: qty 1

## 2018-01-27 NOTE — ED Notes (Signed)
Pt left prior to receiving medication.

## 2018-01-27 NOTE — Discharge Instructions (Signed)
Please follow-up with your neurologist for further evaluation.

## 2018-03-01 ENCOUNTER — Emergency Department
Admission: EM | Admit: 2018-03-01 | Discharge: 2018-03-01 | Disposition: A | Discharge status: Home / Self Care | Payer: BLUE CROSS/BLUE SHIELD | Attending: Emergency Medicine | Admitting: Emergency Medicine

## 2018-03-01 ENCOUNTER — Encounter: Payer: Self-pay | Admitting: *Deleted

## 2018-03-01 ENCOUNTER — Other Ambulatory Visit: Payer: Self-pay

## 2018-03-01 DIAGNOSIS — R569 Unspecified convulsions: Secondary | ICD-10-CM | POA: Diagnosis not present

## 2018-03-01 DIAGNOSIS — Z79899 Other long term (current) drug therapy: Secondary | ICD-10-CM | POA: Diagnosis not present

## 2018-03-01 DIAGNOSIS — F17200 Nicotine dependence, unspecified, uncomplicated: Secondary | ICD-10-CM | POA: Diagnosis not present

## 2018-03-01 HISTORY — DX: Unspecified convulsions: R56.9

## 2018-03-01 LAB — URINE DRUG SCREEN, QUALITATIVE (ARMC ONLY)
Amphetamines, Ur Screen: NOT DETECTED
Barbiturates, Ur Screen: NOT DETECTED
Benzodiazepine, Ur Scrn: NOT DETECTED
Cannabinoid 50 Ng, Ur ~~LOC~~: NOT DETECTED
Cocaine Metabolite,Ur ~~LOC~~: NOT DETECTED
MDMA (Ecstasy)Ur Screen: NOT DETECTED
Methadone Scn, Ur: NOT DETECTED
Opiate, Ur Screen: NOT DETECTED
Phencyclidine (PCP) Ur S: NOT DETECTED
Tricyclic, Ur Screen: NOT DETECTED

## 2018-03-01 LAB — URINALYSIS, COMPLETE (UACMP) WITH MICROSCOPIC
Bilirubin Urine: NEGATIVE
Glucose, UA: NEGATIVE mg/dL
Hgb urine dipstick: NEGATIVE
Ketones, ur: 5 mg/dL — AB
Leukocytes, UA: NEGATIVE
Nitrite: NEGATIVE
Protein, ur: NEGATIVE mg/dL
Specific Gravity, Urine: 1.006 (ref 1.005–1.030)
pH: 7 (ref 5.0–8.0)

## 2018-03-01 LAB — CBC WITH DIFFERENTIAL/PLATELET
BASOS ABS: 0 10*3/uL (ref 0–0.1)
Basophils Relative: 1 %
EOS ABS: 0 10*3/uL (ref 0–0.7)
EOS PCT: 0 %
HCT: 40.2 % (ref 35.0–47.0)
Hemoglobin: 13.6 g/dL (ref 12.0–16.0)
LYMPHS ABS: 1.1 10*3/uL (ref 1.0–3.6)
LYMPHS PCT: 17 %
MCH: 31 pg (ref 26.0–34.0)
MCHC: 33.9 g/dL (ref 32.0–36.0)
MCV: 91.4 fL (ref 80.0–100.0)
MONO ABS: 0.6 10*3/uL (ref 0.2–0.9)
Monocytes Relative: 9 %
Neutro Abs: 4.9 10*3/uL (ref 1.4–6.5)
Neutrophils Relative %: 73 %
PLATELETS: 313 10*3/uL (ref 150–440)
RBC: 4.4 MIL/uL (ref 3.80–5.20)
RDW: 13.7 % (ref 11.5–14.5)
WBC: 6.7 10*3/uL (ref 3.6–11.0)

## 2018-03-01 LAB — ETHANOL: Alcohol, Ethyl (B): <10 mg/dL

## 2018-03-01 LAB — CK: Total CK: 73 U/L (ref 38–234)

## 2018-03-01 LAB — ACETAMINOPHEN LEVEL: Acetaminophen (Tylenol), Serum: <10 ug/mL — ABNORMAL LOW (ref 10–30)

## 2018-03-01 LAB — SALICYLATE LEVEL: Salicylate Lvl: <7 mg/dL (ref 2.8–30.0)

## 2018-03-01 LAB — HCG, QUANTITATIVE, PREGNANCY: hCG, Beta Chain, Quant, S: <1 m[IU]/mL

## 2018-03-01 MED ORDER — IBUPROFEN 600 MG PO TABS
600.0000 mg | ORAL_TABLET | Freq: Once | ORAL | Status: AC
  Administered 2018-03-01: 600 mg via ORAL
  Filled 2018-03-01: qty 1

## 2018-03-01 MED ORDER — LEVETIRACETAM 500 MG PO TABS
1000.0000 mg | ORAL_TABLET | Freq: Once | ORAL | Status: AC
  Administered 2018-03-01: 1000 mg via ORAL
  Filled 2018-03-01: qty 2

## 2018-03-01 NOTE — ED Triage Notes (Signed)
Pt now alert, able to answer questions. She says she does not know what happened. She reports she takes Lamictal at home for her seizures. She says that she is suppose to be taking 25mg  in the morning and 50 at night. Over the past 3 weeks, she has been increasing her dose to 50 mg in the morning and 50mg  and night on her own, her physician is not aware of the same. She says she increased it because of the way she was feeling. Only c/o headache at this time.

## 2018-03-01 NOTE — ED Triage Notes (Signed)
Patient arrives via EMS from work at [employer]. Bystanders reported 2-3 seizures today. Pt has been post ictal for EMS, only responds with eyes opening. Per patient mother, the patient did have seizures yesterday but did not seek medical attention. She has hx of seizures and takes meds for the same. EMS unsure of which med. VSS en\ route.

## 2018-03-01 NOTE — Discharge Instructions (Addendum)
Please follow-up with your neurologist Dr. Malvin JohnsPotter within 1 week for reevaluation and return to the emergency department sooner for any concerns whatsoever.  It was a pleasure to take care of you today, and thank you for coming to our emergency department.  If you have any questions or concerns before leaving please ask the nurse to grab me and I'm more than happy to go through your aftercare instructions again.  If you were prescribed any opioid pain medication today such as Norco, Vicodin, Percocet, morphine, hydrocodone, or oxycodone please make sure you do not drive when you are taking this medication as it can alter your ability to drive safely.  If you have any concerns once you are home that you are not improving or are in fact getting worse before you can make it to your follow-up appointment, please do not hesitate to call [phone] and come back for further evaluation.  Merrily BrittleNeil Merida Alcantar, MD  Results for orders placed or performed during the hospital encounter of 03/01/18  Ethanol  Result Value Ref Range   Alcohol, Ethyl (B) <10 <10 mg/dL  CBC with Differential  Result Value Ref Range   WBC 6.7 3.6 - 11.0 K/uL   RBC 4.40 3.80 - 5.20 MIL/uL   Hemoglobin 13.6 12.0 - 16.0 g/dL   HCT 16.140.2 09.635.0 - 04.547.0 %   MCV 91.4 80.0 - 100.0 fL   MCH 31.0 26.0 - 34.0 pg   MCHC 33.9 32.0 - 36.0 g/dL   RDW 40.913.7 81.111.5 - 91.414.5 %   Platelets 313 150 - 440 K/uL   Neutrophils Relative % 73 %   Neutro Abs 4.9 1.4 - 6.5 K/uL   Lymphocytes Relative 17 %   Lymphs Abs 1.1 1.0 - 3.6 K/uL   Monocytes Relative 9 %   Monocytes Absolute 0.6 0.2 - 0.9 K/uL   Eosinophils Relative 0 %   Eosinophils Absolute 0.0 0 - 0.7 K/uL   Basophils Relative 1 %   Basophils Absolute 0.0 0 - 0.1 K/uL  Urinalysis, Complete w Microscopic  Result Value Ref Range   Color, Urine YELLOW (A) YELLOW   APPearance CLEAR (A) CLEAR   Specific Gravity, Urine 1.006 1.005 - 1.030   pH 7.0 5.0 - 8.0   Glucose, UA NEGATIVE NEGATIVE mg/dL   Hgb  urine dipstick NEGATIVE NEGATIVE   Bilirubin Urine NEGATIVE NEGATIVE   Ketones, ur 5 (A) NEGATIVE mg/dL   Protein, ur NEGATIVE NEGATIVE mg/dL   Nitrite NEGATIVE NEGATIVE   Leukocytes, UA NEGATIVE NEGATIVE   RBC / HPF 0-5 0 - 5 RBC/hpf   WBC, UA 0-5 0 - 5 WBC/hpf   Bacteria, UA RARE (A) NONE SEEN   Squamous Epithelial / LPF 0-5 (A) NONE SEEN   Mucus PRESENT   Urine Drug Screen, Qualitative  Result Value Ref Range   Tricyclic, Ur Screen NONE DETECTED NONE DETECTED   Amphetamines, Ur Screen NONE DETECTED NONE DETECTED   MDMA (Ecstasy)Ur Screen NONE DETECTED NONE DETECTED   Cocaine Metabolite,Ur [place] NONE DETECTED NONE DETECTED   Opiate, Ur Screen NONE DETECTED NONE DETECTED   Phencyclidine (PCP) Ur S NONE DETECTED NONE DETECTED   Cannabinoid 50 Ng, Ur [place] NONE DETECTED NONE DETECTED   Barbiturates, Ur Screen NONE DETECTED NONE DETECTED   Benzodiazepine, Ur Scrn NONE DETECTED NONE DETECTED   Methadone Scn, Ur NONE DETECTED NONE DETECTED  hCG, quantitative, pregnancy  Result Value Ref Range   hCG, Beta Chain, Quant, S <1 <5 mIU/mL  Acetaminophen level  Result  Value Ref Range   Acetaminophen (Tylenol), Serum <10 (L) 10 - 30 ug/mL  Salicylate level  Result Value Ref Range   Salicylate Lvl <7.0 2.8 - 30.0 mg/dL  CK  Result Value Ref Range   Total CK 73 38 - 234 U/L

## 2018-03-01 NOTE — ED Notes (Signed)
ED Provider at bedside. 

## 2018-03-01 NOTE — ED Provider Notes (Signed)
[facility] Emergency Department Provider Note  ____________________________________________   First MD Initiated Contact with Patient 03/01/18 1635     (approximate)  I have reviewed the triage vital signs and the nursing notes.   HISTORY  Chief Complaint Seizures  History limited by the patient's clinical condition  HPI Christina Case is a 22 y.o. female who comes to the emergency department via EMS after a possible seizure while at work today.  The patient has a long-standing history of nonepileptic seizures.  Today apparently at work she had 2-3 episodes where she was shaking and confused somewhat thereafter.  EMS noted that she had a eye-opening but seemed "sluggish" in route.  Normal blood sugar.  She takes Lamictal for her seizures and is supposed to take 25 mg in the morning and 50 mg at night although has recently self titrated herself up to 50 mg twice a day.  Past Medical History:  Diagnosis Date  . Migraine   . ODD (oppositional defiant disorder)   . Seizures [facility])     Patient Active Problem List   Diagnosis Date Noted  . Epigastric pain 05/22/2017  . Other dysphagia 05/22/2017  . 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 PTSD (post-traumatic stress disorder) 07/15/2016  . OCD (obsessive compulsive disorder) 07/15/2016  . Suicidal ideation 07/15/2016  . 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 NO PAST SURGERIES      Prior to Admission medications   Medication Sig Start Date End Date Taking? Authorizing Provider  butalbital-acetaminophen-caffeine (FIORICET, ESGIC) [phone] MG tablet Take 1-2 tablets by mouth every 6 (six) hours as needed for headache. 01/27/18 01/27/19  Rebecka Apley, MD  ketorolac (TORADOL) 10 MG tablet Take 1 tablet (10 mg total) by mouth every 6 (six) hours as needed. Patient not taking: Reported on 01/17/2018  11/02/17   Darci Current, MD  levETIRAcetam (KEPPRA) 500 MG tablet Take 1 tablet (500 mg total) by mouth 2 (two) times daily. 12/20/17   Merrily Brittle, MD  methylPREDNISolone (MEDROL DOSEPAK) 4 MG TBPK tablet Take 1-6 tablets by mouth See admin instructions. Take 6 tablets on day 1 as directed on package and decrease by 1 tablet each day for a total of 6 days. 01/14/18   [provider]  nortriptyline (PAMELOR) 10 MG capsule Take 20 mg by mouth at bedtime. 12/29/17   [provider]  promethazine (PHENERGAN) 25 MG tablet Take 1 tablet (25 mg total) by mouth every 6 (six) hours as needed for nausea or vomiting. Patient not taking: Reported on 01/17/2018 12/20/17   Minna Antis, MD  venlafaxine [facility]) 75 MG tablet Take 37.5-75 mg by mouth daily. Take 37.5 mg daily for one week then 75 mg daily. 01/13/18   [provider]    Allergies Hydrocodone; Oxycodone; and Zofran [ondansetron hcl]  Family History  Problem Relation Age of Onset  . Cancer Neg Hx   . Diabetes Neg Hx   . Hypertension Neg Hx   . Stroke Neg Hx   . Thyroid disease Neg Hx     Social History Social History   Tobacco Use  . Smoking status: Current Every Day Smoker  . Smokeless tobacco: Never Used  Substance Use Topics  . Alcohol use: Yes    Comment: ocassionally  . Drug use: No    Review of Systems History limited by the patient's clinical condition  ____________________________________________  PHYSICAL EXAM:  VITAL SIGNS: ED Triage Vitals [03/01/18 1629]  Enc Vitals Group     BP 125/77     Pulse Rate 82     Resp 16     Temp 98.3 F (36.8 C)     Temp Source Oral     SpO2 100 %     Weight 135 lb (61.2 kg)     Height 5\' 6"  (1.676 m)     Head Circumference      Peak Flow      Pain Score      Pain Loc      Pain Edu?      Excl. in GC?     Constitutional: Appears somewhat postictal slightly confused although rapidly improving Eyes: PERRL EOMI. mid range and  brisk Head: Atraumatic. Nose: No congestion/rhinnorhea. Mouth/Throat: No trismus no bites to the tongue Neck: No stridor.   Cardiovascular: Normal rate, regular rhythm. Grossly normal heart sounds.  Good peripheral circulation. Respiratory: Normal respiratory effort.  No retractions. Lungs CTAB and moving good air Gastrointestinal: Soft nontender Musculoskeletal: No lower extremity edema   Neurologic:  Normal speech and language. No gross focal neurologic deficits are appreciated. Skin:  Skin is warm, dry and intact. No rash noted. Psychiatric: Mood and affect are normal. Speech and behavior are normal.    ____________________________________________   DIFFERENTIAL includes but not limited to  Seizure, pseudoseizure, dehydration, medication noncompliance, Lamictal overdose ____________________________________________   LABS (all labs ordered are listed, but only abnormal results are displayed)  Labs Reviewed  URINALYSIS, COMPLETE (UACMP) WITH MICROSCOPIC - Abnormal; Notable for the following components:      Result Value   Color, Urine YELLOW (*)    APPearance CLEAR (*)    Ketones, ur 5 (*)    Bacteria, UA RARE (*)    Squamous Epithelial / LPF 0-5 (*)    All other components within normal limits  ACETAMINOPHEN LEVEL - Abnormal; Notable for the following components:   Acetaminophen (Tylenol), Serum <10 (*)    All other components within normal limits  ETHANOL  CBC WITH DIFFERENTIAL/PLATELET  URINE DRUG SCREEN, QUALITATIVE ([facility] ONLY)  HCG, QUANTITATIVE, PREGNANCY  SALICYLATE LEVEL  CK  POC URINE PREG, ED  POCT PREGNANCY, URINE    Lab work reviewed by me with no acute disease __________________________________________  EKG  ED ECG REPORT I, Merrily BrittleNeil Aitanna Haubner, the attending physician, personally viewed and interpreted this ECG.  Date: 03/01/2018 EKG Time:  Rate: 80 Rhythm: normal sinus rhythm QRS Axis: normal Intervals: normal ST/T Wave abnormalities:  normal Narrative Interpretation: no evidence of acute ischemia  ____________________________________________  RADIOLOGY   ____________________________________________   PROCEDURES  Procedure(s) performed: no  Procedures  Critical Care performed: no  Observation: no ____________________________________________   INITIAL IMPRESSION / ASSESSMENT AND PLAN / ED COURSE  Pertinent labs & imaging results that were available during my care of the patient were reviewed by me and considered in my medical decision making (see chart for details).  The patient arrives in what appears to be a postictal state.  She has no bites to her tongue and is a normal neuro exam.  On chart review it is unclear whether or not she ever has had a seizure.  She always has a normal MRI and EEG.  Nursing discussed the case with the patient's mother who was obviously frustrated with the situation and said that the patient only seems to have these events when she is at work.  I discussed the case with  [place] [employer] regarding the possibility of Lamictal overdose however 50 mg twice a day is a reasonable dose and she has a normal EKG no sequela of overdose.  At this point the patient is back to her baseline and I will refer her back to her neurologist Dr. Malvin Johns as an outpatient.      ____________________________________________   FINAL CLINICAL IMPRESSION(S) / ED DIAGNOSES  Final diagnoses:  Seizure-like activity (HCC)      NEW MEDICATIONS STARTED DURING THIS VISIT:  Discharge Medication List as of 03/01/2018  6:22 PM       Note:  This document was prepared using Dragon voice recognition software and may include unintentional dictation errors.     Merrily Brittle, MD 03/04/18 2218

## 2018-03-01 NOTE — ED Notes (Signed)
Pt vomited 400cc of clear yellow emesis immediately after taking oral medications. Ambulated to toilet with steady gait to provide urine sample

## 2018-03-02 LAB — POCT PREGNANCY, URINE: Preg Test, Ur: NEGATIVE

## 2018-04-20 ENCOUNTER — Other Ambulatory Visit: Payer: Self-pay

## 2018-04-20 ENCOUNTER — Encounter: Payer: Self-pay | Admitting: Emergency Medicine

## 2018-04-20 ENCOUNTER — Emergency Department
Admission: EM | Admit: 2018-04-20 | Discharge: 2018-04-20 | Disposition: A | Discharge status: Home / Self Care | Payer: BLUE CROSS/BLUE SHIELD | Attending: Emergency Medicine | Admitting: Emergency Medicine

## 2018-04-20 DIAGNOSIS — Z79899 Other long term (current) drug therapy: Secondary | ICD-10-CM | POA: Diagnosis not present

## 2018-04-20 DIAGNOSIS — R569 Unspecified convulsions: Secondary | ICD-10-CM | POA: Insufficient documentation

## 2018-04-20 DIAGNOSIS — F17200 Nicotine dependence, unspecified, uncomplicated: Secondary | ICD-10-CM | POA: Diagnosis not present

## 2018-04-20 LAB — GLUCOSE, CAPILLARY: GLUCOSE-CAPILLARY: 100 mg/dL — AB (ref 65–99)

## 2018-04-20 NOTE — ED Provider Notes (Addendum)
[facility] Emergency Department Provider Note  ____________________________________________   I have reviewed the triage vital signs and the nursing notes. Where available I have reviewed prior notes and, if possible and indicated, outside hospital notes.    HISTORY  Chief Complaint Seizures    HPI Christina Case is a 22 y.o. female with a history of seizure-like activity on Lamictal, patient and her mother were arguing today, this usually brings out her seizure-like activity.  According to notes this only happens at work according to prior statements from the mother.  Patient was at work and had 7 seizure-like activities with no trauma.  EMS came up and talked her while she was having the seizure, I did discuss extensive with EMS.  She is a talk to her she stopped was able to stand up get on the stretcher by herself and was immediately at her baseline.  She has no complaints at this time.  She has had negative EEG and negative MRI for this.  She follows closely with you because appoint with you tomorrow.  She is been eating and drinking well, did not bite her tongue was not incontinent of bowel or bladder.  No other complaints at this time to complete of headache no complaint of trauma     Past Medical History:  Diagnosis Date  . Migraine   . ODD (oppositional defiant disorder)   . Seizures [facility])     Patient Active Problem List   Diagnosis Date Noted  . Epigastric pain 05/22/2017  . Other dysphagia 05/22/2017  . 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 PTSD (post-traumatic stress disorder) 07/15/2016  . OCD (obsessive compulsive disorder) 07/15/2016  . Suicidal ideation 07/15/2016  . 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 NO PAST SURGERIES      Prior to Admission medications   Medication Sig Start Date End Date Taking? Authorizing Provider   butalbital-acetaminophen-caffeine (FIORICET, ESGIC) [phone] MG tablet Take 1-2 tablets by mouth every 6 (six) hours as needed for headache. 01/27/18 01/27/19  Rebecka Apley, MD  ketorolac (TORADOL) 10 MG tablet Take 1 tablet (10 mg total) by mouth every 6 (six) hours as needed. Patient not taking: Reported on 01/17/2018 11/02/17   Darci Current, MD  levETIRAcetam (KEPPRA) 500 MG tablet Take 1 tablet (500 mg total) by mouth 2 (two) times daily. 12/20/17   Merrily Brittle, MD  methylPREDNISolone (MEDROL DOSEPAK) 4 MG TBPK tablet Take 1-6 tablets by mouth See admin instructions. Take 6 tablets on day 1 as directed on package and decrease by 1 tablet each day for a total of 6 days. 01/14/18   [provider]  nortriptyline (PAMELOR) 10 MG capsule Take 20 mg by mouth at bedtime. 12/29/17   [provider]  promethazine (PHENERGAN) 25 MG tablet Take 1 tablet (25 mg total) by mouth every 6 (six) hours as needed for nausea or vomiting. Patient not taking: Reported on 01/17/2018 12/20/17   Minna Antis, MD  venlafaxine [facility]) 75 MG tablet Take 37.5-75 mg by mouth daily. Take 37.5 mg daily for one week then 75 mg daily. 01/13/18   [provider]    Allergies Hydrocodone; Oxycodone; and Zofran [ondansetron hcl]  Family History  Problem Relation Age of Onset  . Cancer Neg Hx   . Diabetes Neg Hx   . Hypertension Neg Hx   . Stroke Neg Hx   . Thyroid disease  Neg Hx     Social History Social History   Tobacco Use  . Smoking status: Current Every Day Smoker  . Smokeless tobacco: Never Used  Substance Use Topics  . Alcohol use: Yes    Comment: ocassionally  . Drug use: No    Review of Systems Constitutional: No fever/chills Eyes: No visual changes. ENT: No sore throat. No stiff neck no neck pain Cardiovascular: Denies chest pain. Respiratory: Denies shortness of breath. Gastrointestinal:   no vomiting.  No diarrhea.  No constipation. Genitourinary:  Negative for dysuria. Musculoskeletal: Negative lower extremity swelling Skin: Negative for rash. Neurological: Negative for severe headaches, focal weakness or numbness.   ____________________________________________   PHYSICAL EXAM:  VITAL SIGNS: ED Triage Vitals  Enc Vitals Group     BP 04/20/18 1915 127/77     Pulse Rate 04/20/18 1915 97     Resp 04/20/18 1915 13     Temp 04/20/18 1915 98.8 F (37.1 C)     Temp Source 04/20/18 1915 Oral     SpO2 04/20/18 1915 100 %     Weight 04/20/18 1916 140 lb (63.5 kg)     Height 04/20/18 1916 5\' 6"  (1.676 m)     Head Circumference --      Peak Flow --      Pain Score 04/20/18 1915 5     Pain Loc --      Pain Edu? --      Excl. in GC? --     Constitutional: Alert and oriented. Well appearing and in no acute distress. Eyes: Conjunctivae are normal Head: Atraumatic HEENT: No congestion/rhinnorhea. Mucous membranes are moist.  Oropharynx non-erythematous Neck:   Nontender with no meningismus, no masses, no stridor Cardiovascular: Normal rate, regular rhythm. Grossly normal heart sounds.  Good peripheral circulation. Respiratory: Normal respiratory effort.  No retractions. Lungs CTAB. Abdominal: Soft and nontender. No distention. No guarding no rebound Back:  There is no focal tenderness or step off.  there is no midline tenderness there are no lesions noted. there is no CVA tenderness  Musculoskeletal: No lower extremity tenderness, no upper extremity tenderness. No joint effusions, no DVT signs strong distal pulses no edema Neurologic:  Normal speech and language. No gross focal neurologic deficits are appreciated.  Skin:  Skin is warm, dry and intact. No rash noted. Psychiatric: Mood and affect are normal. Speech and behavior are normal.  ____________________________________________   LABS (all labs ordered are listed, but only abnormal results are displayed)  Labs Reviewed  GLUCOSE, CAPILLARY - Abnormal; Notable for the  following components:      Result Value   Glucose-Capillary 100 (*)    All other components within normal limits    Pertinent labs  results that were available during my care of the patient were reviewed by me and considered in my medical decision making (see chart for details). ____________________________________________  EKG  I personally interpreted any EKGs ordered by me or triage Normal sinus rhythm rate 90 bpm, RSR prime configuration no acute ischemia ____________________________________________  RADIOLOGY  Pertinent labs & imaging results that were available during my care of the patient were reviewed by me and considered in my medical decision making (see chart for details). If possible, patient and/or family made aware of any abnormal findings.  No results found. ____________________________________________    PROCEDURES  Procedure(s) performed: None  Procedures  Critical Care performed: None  ____________________________________________   INITIAL IMPRESSION / ASSESSMENT AND PLAN / ED COURSE  Pertinent labs &  imaging results that were available during my care of the patient were reviewed by me and considered in my medical decision making (see chart for details).  Here in no acute distress with what likely was an epileptiform seizure like activity today.  Sugar is normal, she is already on anti-epileptic/mood stabilizing medication.  I do not see any utility in blood work at this time, she denies pregnancy, this is a chronic recurrent problem for her.  We will discharge her.  Advised not to drive second time climb ladders or do anything else could cause her harm in my customary seizure precautions.    ____________________________________________   FINAL CLINICAL IMPRESSION(S) / ED DIAGNOSES  Final diagnoses:  None      This chart was dictated using voice recognition software.  Despite best efforts to proofread,  errors can occur which can change meaning.       Jeanmarie PlantMcShane, Kiyomi Pallo A, MD 04/20/18 1926    Jeanmarie PlantMcShane, Kate Larock A, MD 04/20/18 [phone]

## 2018-04-20 NOTE — ED Triage Notes (Signed)
Patient coming in for complaint for seizures with known hx of seizures. Patient states she had 7 seizures but was able to respond to EMS when in seizures. Patient vitals stable for EMS.

## 2018-04-20 NOTE — Discharge Instructions (Addendum)
Not drive, climb ladders, soaking the tub, or do anything else, which, if interrupted by seizure, could cause you harm.  Follow closely with your neurologist tomorrow continue taking her medications as prescribed.  If you have fevers, severe headache, multiple seizures in a row, seizures are from your baseline or other concerns please return to the emergency department.

## 2018-05-23 ENCOUNTER — Emergency Department: Payer: BLUE CROSS/BLUE SHIELD

## 2018-05-23 ENCOUNTER — Other Ambulatory Visit: Payer: Self-pay

## 2018-05-23 ENCOUNTER — Emergency Department
Admission: EM | Admit: 2018-05-23 | Discharge: 2018-05-23 | Disposition: A | Discharge status: Home / Self Care | Payer: BLUE CROSS/BLUE SHIELD | Attending: Emergency Medicine | Admitting: Emergency Medicine

## 2018-05-23 DIAGNOSIS — Z79899 Other long term (current) drug therapy: Secondary | ICD-10-CM | POA: Diagnosis not present

## 2018-05-23 DIAGNOSIS — F17200 Nicotine dependence, unspecified, uncomplicated: Secondary | ICD-10-CM | POA: Diagnosis not present

## 2018-05-23 DIAGNOSIS — R1031 Right lower quadrant pain: Secondary | ICD-10-CM | POA: Diagnosis present

## 2018-05-23 DIAGNOSIS — N200 Calculus of kidney: Secondary | ICD-10-CM

## 2018-05-23 LAB — BASIC METABOLIC PANEL
Anion gap: 7 (ref 5–15)
BUN: 17 mg/dL (ref 6–20)
CALCIUM: 9.3 mg/dL (ref 8.9–10.3)
CHLORIDE: 111 mmol/L (ref 98–111)
CO2: 22 mmol/L (ref 22–32)
Creatinine, Ser: 0.77 mg/dL (ref 0.44–1.00)
GFR calc Af Amer: >60 mL/min (ref >60)
GFR calc non Af Amer: >60 mL/min (ref >60)
Glucose, Bld: 103 mg/dL — ABNORMAL HIGH (ref 70–99)
Potassium: 3.7 mmol/L (ref 3.5–5.1)
Sodium: 140 mmol/L (ref 135–145)

## 2018-05-23 LAB — POCT PREGNANCY, URINE: PREG TEST UR: NEGATIVE

## 2018-05-23 LAB — URINALYSIS, COMPLETE (UACMP) WITH MICROSCOPIC
Bilirubin Urine: NEGATIVE
GLUCOSE, UA: NEGATIVE mg/dL
KETONES UR: NEGATIVE mg/dL
LEUKOCYTES UA: NEGATIVE
NITRITE: NEGATIVE
PROTEIN: NEGATIVE mg/dL
Specific Gravity, Urine: 1.012 (ref 1.005–1.030)
pH: 6 (ref 5.0–8.0)

## 2018-05-23 LAB — CBC
HCT: 37.4 % (ref 35.0–47.0)
HEMOGLOBIN: 12.9 g/dL (ref 12.0–16.0)
MCH: 30.8 pg (ref 26.0–34.0)
MCHC: 34.5 g/dL (ref 32.0–36.0)
MCV: 89.3 fL (ref 80.0–100.0)
Platelets: 297 10*3/uL (ref 150–440)
RBC: 4.19 MIL/uL (ref 3.80–5.20)
RDW: 13.1 % (ref 11.5–14.5)
WBC: 9.4 10*3/uL (ref 3.6–11.0)

## 2018-05-23 MED ORDER — HALOPERIDOL LACTATE 5 MG/ML IJ SOLN
2.5000 mg | Freq: Once | INTRAMUSCULAR | Status: AC
  Administered 2018-05-23: 2.5 mg via INTRAVENOUS

## 2018-05-23 MED ORDER — KETOROLAC TROMETHAMINE 30 MG/ML IJ SOLN
15.0000 mg | Freq: Once | INTRAMUSCULAR | Status: AC
Start: 2018-05-23 — End: 2018-05-23
  Administered 2018-05-23: 15 mg via INTRAVENOUS

## 2018-05-23 MED ORDER — KETOROLAC TROMETHAMINE 30 MG/ML IJ SOLN
INTRAMUSCULAR | Status: AC
  Filled 2018-05-23: qty 1

## 2018-05-23 MED ORDER — METOCLOPRAMIDE HCL 10 MG PO TABS: 10.0000 mg | ORAL_TABLET | Freq: Three times a day (TID) | ORAL | 0 refills | Status: DC

## 2018-05-23 MED ORDER — IBUPROFEN 600 MG PO TABS: 600.0000 mg | ORAL_TABLET | Freq: Three times a day (TID) | ORAL | 0 refills | Status: DC | PRN

## 2018-05-23 MED ORDER — HYDROCODONE-ACETAMINOPHEN 5-325 MG PO TABS: 1.0000 | ORAL_TABLET | Freq: Four times a day (QID) | ORAL | 0 refills | Status: DC | PRN

## 2018-05-23 MED ORDER — HALOPERIDOL LACTATE 5 MG/ML IJ SOLN
INTRAMUSCULAR | Status: AC
  Filled 2018-05-23: qty 1

## 2018-05-23 NOTE — ED Provider Notes (Signed)
[facility] Emergency Department Provider Note  ____________________________________________   First MD Initiated Contact with Patient 05/23/18 [phone]     (approximate)  I have reviewed the triage vital signs and the nursing notes.   HISTORY  Chief Complaint Back Pain   HPI Christina Case is a 22 y.o. female who self presents to the emergency department with sudden onset severe right flank and right lower quadrant pain associated with nausea.  The pain is constant.  No dysuria.  No hematuria.  No fevers or chills.  She has had 3 kidney stones in the past.  She has never had an abdominal surgery.  Her pain is severe radiating from her flank towards her groin.  Nothing seems to make it better or worse.    Past Medical History:  Diagnosis Date  . Migraine   . ODD (oppositional defiant disorder)   . Seizures [facility])     Patient Active Problem List   Diagnosis Date Noted  . Epigastric pain 05/22/2017  . Other dysphagia 05/22/2017  . 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 PTSD (post-traumatic stress disorder) 07/15/2016  . OCD (obsessive compulsive disorder) 07/15/2016  . Suicidal ideation 07/15/2016  . 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 NO PAST SURGERIES      Prior to Admission medications   Medication Sig Start Date End Date Taking? Authorizing Provider  butalbital-acetaminophen-caffeine (FIORICET, ESGIC) [phone] MG tablet Take 1-2 tablets by mouth every 6 (six) hours as needed for headache. 01/27/18 01/27/19  Rebecka Apley, MD  HYDROcodone-acetaminophen (NORCO) 5-325 MG tablet Take 1 tablet by mouth every 6 (six) hours as needed for up to 7 doses for severe pain. 05/23/18   Merrily Brittle, MD  ibuprofen (ADVIL,MOTRIN) 600 MG tablet Take 1 tablet (600 mg total) by mouth every 8 (eight) hours as needed. 05/23/18   Merrily Brittle, MD  ketorolac  (TORADOL) 10 MG tablet Take 1 tablet (10 mg total) by mouth every 6 (six) hours as needed. Patient not taking: Reported on 01/17/2018 11/02/17   Darci Current, MD  levETIRAcetam (KEPPRA) 500 MG tablet Take 1 tablet (500 mg total) by mouth 2 (two) times daily. 12/20/17   Merrily Brittle, MD  methylPREDNISolone (MEDROL DOSEPAK) 4 MG TBPK tablet Take 1-6 tablets by mouth See admin instructions. Take 6 tablets on day 1 as directed on package and decrease by 1 tablet each day for a total of 6 days. 01/14/18   [provider]  metoCLOPramide (REGLAN) 10 MG tablet Take 1 tablet (10 mg total) by mouth 3 (three) times daily with meals. 05/23/18 05/23/19  Merrily Brittle, MD  nortriptyline (PAMELOR) 10 MG capsule Take 20 mg by mouth at bedtime. 12/29/17   [provider]  promethazine (PHENERGAN) 25 MG tablet Take 1 tablet (25 mg total) by mouth every 6 (six) hours as needed for nausea or vomiting. Patient not taking: Reported on 01/17/2018 12/20/17   Minna Antis, MD  venlafaxine [facility]) 75 MG tablet Take 37.5-75 mg by mouth daily. Take 37.5 mg daily for one week then 75 mg daily. 01/13/18   [provider]    Allergies Hydrocodone; Oxycodone; and Zofran [ondansetron hcl]  Family History  Problem Relation Age of Onset  . Cancer Neg Hx   . Diabetes Neg Hx   . Hypertension Neg Hx   . Stroke Neg Hx   . Thyroid disease Neg Hx  Social History Social History   Tobacco Use  . Smoking status: Current Every Day Smoker  . Smokeless tobacco: Never Used  Substance Use Topics  . Alcohol use: Yes    Comment: ocassionally  . Drug use: No    Review of Systems Constitutional: No fever/chills Eyes: No visual changes. ENT: No sore throat. Cardiovascular: Denies chest pain. Respiratory: Denies shortness of breath. Gastrointestinal: Positive for abdominal pain.  Positive for nausea, no vomiting.  No diarrhea.  No constipation. Genitourinary: Negative for  dysuria. Musculoskeletal: Positive for back pain. Skin: Negative for rash. Neurological: Negative for headaches, focal weakness or numbness.   ____________________________________________   PHYSICAL EXAM:  VITAL SIGNS: ED Triage Vitals  Enc Vitals Group     BP 05/23/18 0301 130/85     Pulse Rate 05/23/18 0301 90     Resp 05/23/18 0301 16     Temp 05/23/18 0301 98.9 F (37.2 C)     Temp Source 05/23/18 0301 Oral     SpO2 05/23/18 0301 100 %     Weight 05/23/18 0302 133 lb (60.3 kg)     Height 05/23/18 0302 5\' 6"  (1.676 m)     Head Circumference --      Peak Flow --      Pain Score 05/23/18 0302 10     Pain Loc --      Pain Edu? --      Excl. in GC? --     Constitutional: Alert and oriented x4 appears quite uncomfortable crying in pain Eyes: PERRL EOMI. Head: Atraumatic. Nose: No congestion/rhinnorhea. Mouth/Throat: No trismus Neck: No stridor.   Cardiovascular: Normal rate, regular rhythm. Grossly normal heart sounds.  Good peripheral circulation. Respiratory: Normal respiratory effort.  No retractions. Lungs CTAB and moving good air Gastrointestinal: Soft somewhat tender in the right lower quadrant although not over McBurney's no rebound or guarding no peritonitis negative Rovsing's no costovertebral tenderness Musculoskeletal: No lower extremity edema   Neurologic:  Normal speech and language. No gross focal neurologic deficits are appreciated. Skin:  Skin is warm, dry and intact. No rash noted. Psychiatric: Mood and affect are normal. Speech and behavior are normal.    ____________________________________________   DIFFERENTIAL includes but not limited to  Urinate tract infection, pyelonephritis, appendicitis, ovarian torsion ____________________________________________   LABS (all labs ordered are listed, but only abnormal results are displayed)  Labs Reviewed  URINALYSIS, COMPLETE (UACMP) WITH MICROSCOPIC - Abnormal; Notable for the following components:       Result Value   Color, Urine YELLOW (*)    APPearance HAZY (*)    Hgb urine dipstick MODERATE (*)    RBC / HPF >50 (*)    Bacteria, UA RARE (*)    All other components within normal limits  BASIC METABOLIC PANEL - Abnormal; Notable for the following components:   Glucose, Bld 103 (*)    All other components within normal limits  CBC  POC URINE PREG, ED  POCT PREGNANCY, URINE    Lab work reviewed by me with hematuria but no evidence of infection __________________________________________  EKG   ____________________________________________  RADIOLOGY  CT stone reviewed by me consistent with 3 mm stone at the UVJ on the right ____________________________________________   PROCEDURES  Procedure(s) performed: no  Procedures  Critical Care performed: no  ____________________________________________   INITIAL IMPRESSION / ASSESSMENT AND PLAN / ED COURSE  Pertinent labs & imaging results that were available during my care of the patient were reviewed by me and considered  in my medical decision making (see chart for details).   The patient arrives very uncomfortable appearing with severe abdominal pain.  Given IV Toradol for pain and IV Haldol for nausea with near complete resolution of her symptoms.  CT does show a 3 mm stone at the UVJ on the right.  She has normal renal function and no evidence of infection in her urine.  I will treat her symptomatically and refer to urology.  She is discharged home in improved condition with a short course of hydrocodone and ibuprofen.  The patient and mom verbalized understanding agreement the plan.      ____________________________________________   FINAL CLINICAL IMPRESSION(S) / ED DIAGNOSES  Final diagnoses:  Kidney stone      NEW MEDICATIONS STARTED DURING THIS VISIT:  Discharge Medication List as of 05/23/2018  4:13 AM    START taking these medications   Details  HYDROcodone-acetaminophen (NORCO) 5-325 MG tablet  Take 1 tablet by mouth every 6 (six) hours as needed for up to 7 doses for severe pain., Starting Sat 05/23/2018, Print    ibuprofen (ADVIL,MOTRIN) 600 MG tablet Take 1 tablet (600 mg total) by mouth every 8 (eight) hours as needed., Starting Sat 05/23/2018, Print    metoCLOPramide (REGLAN) 10 MG tablet Take 1 tablet (10 mg total) by mouth 3 (three) times daily with meals., Starting Sat 05/23/2018, Until Sun 05/23/2019, Print         Note:  This document was prepared using Dragon voice recognition software and may include unintentional dictation errors.     Merrily Brittle, MD 05/23/18 [phone]

## 2018-05-23 NOTE — ED Notes (Signed)
Patient transported to CT 

## 2018-05-23 NOTE — ED Notes (Signed)
Patient given water per request.

## 2018-05-23 NOTE — ED Notes (Signed)
Patient returned from CT

## 2018-05-23 NOTE — ED Triage Notes (Signed)
Pt states is having "severe" right lower back pain with hematuria yesterday. Pt is nauseated. Pt states hematuria is gone but she is now also having vaginal bleeding.

## 2018-05-23 NOTE — ED Notes (Signed)
Reviewed discharge instructions, follow-up care, and prescriptions with patient. Patient verbalized understanding of all information reviewed. Patient stable, with no distress noted at this time.    

## 2018-05-23 NOTE — Discharge Instructions (Signed)
The most important thing to know with kidney stones is that if you develop a fever or chills or urinary tract infection while you have this kidney stone you must come immediately to the emergency department.  This is life-threatening.  It was a pleasure to take care of you today, and thank you for coming to our emergency department.  If you have any questions or concerns before leaving please ask the nurse to grab me and I'm more than happy to go through your aftercare instructions again.  If you were prescribed any opioid pain medication today such as Norco, Vicodin, Percocet, morphine, hydrocodone, or oxycodone please make sure you do not drive when you are taking this medication as it can alter your ability to drive safely.  If you have any concerns once you are home that you are not improving or are in fact getting worse before you can make it to your follow-up appointment, please do not hesitate to call 911 and come back for further evaluation.  Merrily BrittleNeil Seleni Meller, MD  Results for orders placed or performed during the hospital encounter of 05/23/18  Urinalysis, Complete w Microscopic  Result Value Ref Range   Color, Urine YELLOW (A) YELLOW   APPearance HAZY (A) CLEAR   Specific Gravity, Urine 1.012 1.005 - 1.030   pH 6.0 5.0 - 8.0   Glucose, UA NEGATIVE NEGATIVE mg/dL   Hgb urine dipstick MODERATE (A) NEGATIVE   Bilirubin Urine NEGATIVE NEGATIVE   Ketones, ur NEGATIVE NEGATIVE mg/dL   Protein, ur NEGATIVE NEGATIVE mg/dL   Nitrite NEGATIVE NEGATIVE   Leukocytes, UA NEGATIVE NEGATIVE   RBC / HPF >50 (H) 0 - 5 RBC/hpf   WBC, UA 6-10 0 - 5 WBC/hpf   Bacteria, UA RARE (A) NONE SEEN   Squamous Epithelial / LPF 0-5 0 - 5   Mucus PRESENT   Basic metabolic panel  Result Value Ref Range   Sodium 140 135 - 145 mmol/L   Potassium 3.7 3.5 - 5.1 mmol/L   Chloride 111 98 - 111 mmol/L   CO2 22 22 - 32 mmol/L   Glucose, Bld 103 (H) 70 - 99 mg/dL   BUN 17 6 - 20 mg/dL   Creatinine, Ser 2.950.77 0.44  - 1.00 mg/dL   Calcium 9.3 8.9 - 62.110.3 mg/dL   GFR calc non Af Amer >60 >60 mL/min   GFR calc Af Amer >60 >60 mL/min   Anion gap 7 5 - 15  CBC  Result Value Ref Range   WBC 9.4 3.6 - 11.0 K/uL   RBC 4.19 3.80 - 5.20 MIL/uL   Hemoglobin 12.9 12.0 - 16.0 g/dL   HCT 30.837.4 65.735.0 - 84.647.0 %   MCV 89.3 80.0 - 100.0 fL   MCH 30.8 26.0 - 34.0 pg   MCHC 34.5 32.0 - 36.0 g/dL   RDW 96.213.1 95.211.5 - 84.114.5 %   Platelets 297 150 - 440 K/uL  Pregnancy, urine POC  Result Value Ref Range   Preg Test, Ur NEGATIVE NEGATIVE   Ct Renal Stone Study  Result Date: 05/23/2018 CLINICAL DATA:  Severe right lower back pain with hematuria yesterday. Nausea. Now vaginal bleeding. Negative pregnancy test. EXAM: CT ABDOMEN AND PELVIS WITHOUT CONTRAST TECHNIQUE: Multidetector CT imaging of the abdomen and pelvis was performed following the standard protocol without IV contrast. COMPARISON:  11/02/2017 FINDINGS: Lower chest: Lung bases are clear. Hepatobiliary: No focal liver abnormality is seen. No gallstones, gallbladder wall thickening, or biliary dilatation. Pancreas: Unremarkable. No pancreatic ductal  dilatation or surrounding inflammatory changes. Spleen: Normal in size without focal abnormality. Adrenals/Urinary Tract: No adrenal gland nodules. Multiple punctate sized intrarenal stones bilaterally. 3 mm stone in the distal right ureter at the ureterovesical junction. There is proximal hydronephrosis and hydroureter with stranding around the right kidney and ureter. Left ureter and renal collecting system are decompressed. Bladder wall is mildly thickened, likely due to under distention. Stomach/Bowel: Stomach, small bowel, and colon are not abnormally distended. Scattered stool throughout the colon. No bowel wall thickening or inflammatory changes are demonstrated although decompression of bowel limits evaluation. Appendix is not identified. Vascular/Lymphatic: No significant vascular findings are present. No enlarged abdominal or  pelvic lymph nodes. Reproductive: Uterus and bilateral adnexa are unremarkable. Other: No abdominal wall hernia or abnormality. No abdominopelvic ascites. Musculoskeletal: No acute or significant osseous findings. IMPRESSION: 3 mm stone in the distal right ureter with moderate proximal obstruction. Multiple bilateral nonobstructing intrarenal stones. Bladder wall thickening is likely due to under distention. Cystitis also a possibility. Correlation with urinalysis recommended. Electronically Signed   By: Burman Nieves M.D.   On: 05/23/2018 03:59

## 2019-07-22 ENCOUNTER — Other Ambulatory Visit (HOSPITAL_COMMUNITY)
Admission: RE | Admit: 2019-07-22 | Discharge: 2019-07-22 | Disposition: A | Discharge status: Home / Self Care | Payer: BC Managed Care – PPO | Source: Ambulatory Visit | Attending: Obstetrics and Gynecology | Admitting: Obstetrics and Gynecology

## 2019-07-22 ENCOUNTER — Encounter: Payer: Self-pay | Admitting: Obstetrics and Gynecology

## 2019-07-22 ENCOUNTER — Other Ambulatory Visit: Payer: Self-pay

## 2019-07-22 ENCOUNTER — Ambulatory Visit (INDEPENDENT_AMBULATORY_CARE_PROVIDER_SITE_OTHER): Payer: BC Managed Care – PPO | Admitting: Obstetrics and Gynecology

## 2019-07-22 VITALS — BP 110/64 | HR 79 | Ht 66.0 in | Wt 172.0 lb

## 2019-07-22 DIAGNOSIS — Z0000 Encounter for general adult medical examination without abnormal findings: Secondary | ICD-10-CM

## 2019-07-22 DIAGNOSIS — Z30430 Encounter for insertion of intrauterine contraceptive device: Secondary | ICD-10-CM

## 2019-07-22 DIAGNOSIS — Z124 Encounter for screening for malignant neoplasm of cervix: Secondary | ICD-10-CM | POA: Insufficient documentation

## 2019-07-22 DIAGNOSIS — Z01419 Encounter for gynecological examination (general) (routine) without abnormal findings: Secondary | ICD-10-CM | POA: Diagnosis not present

## 2019-07-22 DIAGNOSIS — Z1239 Encounter for other screening for malignant neoplasm of breast: Secondary | ICD-10-CM

## 2019-07-22 NOTE — Progress Notes (Signed)
Gynecology Annual Exam  PCP: Nira Retort  Chief Complaint:  Chief Complaint  Patient presents with  . Gynecologic Exam    Talk about full bc options, heavy bleeding with last period     History of Present Illness: Patient is a 23 y.o. G0P0000 presents for annual exam. The patient has no complaints today.   LMP: Patient's last menstrual period was 07/14/2019 (exact date). Average Interval: regular, 28 days Duration of flow: 7 days Heavy Menses: yes Clots: yes Intermenstrual Bleeding: no Postcoital Bleeding: no Dysmenorrhea: yes  The patient is sexually active. She currently uses condoms for contraception. She denies dyspareunia.  The patient does perform self breast exams.  There is no notable family history of breast or ovarian cancer in her family.  The patient wears seatbelts: yes.  The patient has regular exercise: no.    The patient denies current symptoms of depression.    Review of Systems: ROS  Past Medical History:  Past Medical History:  Diagnosis Date  . Migraine   . ODD (oppositional defiant disorder)   . Seizures (HCC)     Past Surgical History:  Past Surgical History:  Procedure Laterality Date  . NO PAST SURGERIES      Gynecologic History:  Patient's last menstrual period was 07/14/2019 (exact date). Contraception: condoms Last Pap: Results were: unknown  Obstetric History: G0P0000  Family History:  Family History  Problem Relation Age of Onset  . Cancer Neg Hx   . Diabetes Neg Hx   . Hypertension Neg Hx   . Stroke Neg Hx   . Thyroid disease Neg Hx     Social History:  Social History   Socioeconomic History  . Marital status: Single    Spouse name: Not on file  . Number of children: Not on file  . Years of education: Not on file  . Highest education level: Not on file  Occupational History  . Not on file  Social Needs  . Financial resource strain: Not on file  . Food insecurity    Worry: Not on file   Inability: Not on file  . Transportation needs    Medical: Not on file    Non-medical: Not on file  Tobacco Use  . Smoking status: Current Some Day Smoker  . Smokeless tobacco: Never Used  Substance and Sexual Activity  . Alcohol use: Yes    Comment: ocassionally  . Drug use: No  . Sexual activity: Yes    Birth control/protection: None, Condom  Lifestyle  . Physical activity    Days per week: Not on file    Minutes per session: Not on file  . Stress: Not on file  Relationships  . Social Musician on phone: Not on file    Gets together: Not on file    Attends religious service: Not on file    Active member of club or organization: Not on file    Attends meetings of clubs or organizations: Not on file    Relationship status: Not on file  . Intimate partner violence    Fear of current or ex partner: Not on file    Emotionally abused: Not on file    Physically abused: Not on file    Forced sexual activity: Not on file  Other Topics Concern  . Not on file  Social History Narrative  . Not on file    Allergies:  Allergies  Allergen Reactions  . Hydrocodone Other (See Comments)  dizziness  . Oxycodone Other (See Comments)    dizziness dizziness  . Zofran [Ondansetron Hcl] Nausea And Vomiting    Medications: Prior to Admission medications   Medication Sig Start Date End Date Taking? Authorizing Provider  lamoTRIgine (LAMICTAL) 150 MG tablet Take by mouth. 07/02/19 07/01/20 Yes [provider]  ibuprofen (ADVIL,MOTRIN) 600 MG tablet Take 1 tablet (600 mg total) by mouth every 8 (eight) hours as needed. Patient not taking: Reported on 07/22/2019 05/23/18   Darel Hong, MD    Physical Exam Vitals: Blood pressure 110/64, pulse 79, height 5\' 6"  (1.676 m), weight 172 lb (78 kg), last menstrual period 07/14/2019.  General: NAD HEENT: normocephalic, anicteric Thyroid: no enlargement, no palpable nodules Pulmonary: No increased work of breathing, CTAB  Cardiovascular: RRR, distal pulses 2+ Breast: Breast symmetrical, no tenderness, no palpable nodules or masses, no skin or nipple retraction present, no nipple discharge.  No axillary or supraclavicular lymphadenopathy. Abdomen: NABS, soft, non-tender, non-distended.  Umbilicus without lesions.  No hepatomegaly, splenomegaly or masses palpable. No evidence of hernia  Genitourinary:  External: Normal external female genitalia.  Normal urethral meatus, normal Bartholin's and Skene's glands.    Vagina: Normal vaginal mucosa, no evidence of prolapse.    Cervix: Grossly normal in appearance, no bleeding  Uterus: Non-enlarged, mobile, normal contour.  No CMT  Adnexa: ovaries non-enlarged, no adnexal masses  Rectal: deferred  Lymphatic: no evidence of inguinal lymphadenopathy Extremities: no edema, erythema, or tenderness Neurologic: Grossly intact Psychiatric: mood appropriate, affect full  Female chaperone present for pelvic and breast  portions of the physical exam  IUD PROCEDURE NOTE:  DALEISA HALPERIN is a 23 y.o. G0P0000 here for IUD insertion. No GYN concerns.  Last pap smear was normal.  IUD Insertion Procedure Note Patient identified, informed consent performed, consent signed.   Discussed risks of irregular bleeding, cramping, infection, malpositioning or misplacement of the IUD outside the uterus which may require further procedure such as laparoscopy, risk of failure <1%. Time out was performed.  Patient's LMP was 7 days ago. She denies intercourse since that time. She will repeat urine pregnancy test in 2 weeks at home.   A bimanual exam showed the uterus to be anteverted.  Speculum placed in the vagina.  Cervix visualized.  Cleaned with Betadine x 2.  Grasped anteriorly with a single tooth tenaculum.  Uterus sounded to 7 cm.   IUD placed per manufacturer's recommendations.  Strings trimmed to 3 cm. Tenaculum was removed, good hemostasis noted.  Patient tolerated procedure well.    Patient was given post-procedure instructions.  She was advised to have backup contraception for one week.  Patient was also asked to check IUD strings periodically and follow up in 4 weeks for IUD check.  Assessment: 23 y.o. G0P0000 routine annual exam  Plan: Problem List Items Addressed This Visit    None    Visit Diagnoses    Screening for cervical cancer    -  Primary   Healthcare maintenance       Encounter for IUD insertion       Screening breast examination          1)  Gardasil Series discussed and if applicable offered to patient - Patient has previously completed 3 shot series   2) STI screening  was offered and accepted  3)  ASCCP guidelines and rational discussed.  Patient opts for every 3 years screening interval  4) Contraception - the patient is currently using condoms.  She is  interested in changing to IUD, mirena We discussed safe sex practices to reduce her furture risk of STI's.   Will repeat urine pregnancy test in 2 weeks at home.  Will follow up in 1 month for IUD check  5) No follow-ups on file.  Adelene Idlerhristanna Schuman MD [facility], [facility] 07/22/2019 9:00 AM

## 2019-07-23 ENCOUNTER — Telehealth: Payer: Self-pay | Admitting: Obstetrics and Gynecology

## 2019-07-23 NOTE — Telephone Encounter (Signed)
Patient is calling with questions about a medication she is currently taking and the reaction to plan B. Patient has had a IUD placed. Please advise

## 2019-07-23 NOTE — Telephone Encounter (Signed)
Pt was called and discussed that it was okay for her to take plan B with the lamictal and it was okay to take the lamictal with the IUD. Pt verbally understood.

## 2019-07-28 LAB — CYTOLOGY - PAP
Chlamydia: NEGATIVE
Diagnosis: NEGATIVE
Neisseria Gonorrhea: NEGATIVE

## 2019-07-28 NOTE — Progress Notes (Signed)
NIL, released to mychart with note.

## 2019-08-19 ENCOUNTER — Ambulatory Visit: Payer: BC Managed Care – PPO | Admitting: Obstetrics and Gynecology

## 2019-09-02 ENCOUNTER — Encounter: Payer: Self-pay | Admitting: Obstetrics and Gynecology

## 2019-09-02 ENCOUNTER — Other Ambulatory Visit: Payer: Self-pay

## 2019-09-02 ENCOUNTER — Ambulatory Visit (INDEPENDENT_AMBULATORY_CARE_PROVIDER_SITE_OTHER): Payer: BC Managed Care – PPO | Admitting: Obstetrics and Gynecology

## 2019-09-02 VITALS — BP 110/74 | Ht 66.0 in | Wt 174.0 lb

## 2019-09-02 DIAGNOSIS — Z30431 Encounter for routine checking of intrauterine contraceptive device: Secondary | ICD-10-CM

## 2019-09-02 NOTE — Progress Notes (Signed)
  History of Present Illness:  Christina Case is a 23 y.o. that had a Mirena IUD placed approximately 1 month ago. Since that time, she states that she has had small persistent spotting, some cramps after intercourse improved with midol.   Minimal pain. Overall happy with new contraception and wishes to continue.   PMHx: She  has a past medical history of Migraine, ODD (oppositional defiant disorder), and Seizures ([place]). Also,  has a past surgical history that includes No past surgeries., family history is not on file.,  reports that she has been smoking. She has never used smokeless tobacco. She reports current alcohol use. She reports that she does not use drugs. Current Meds  Medication Sig  . lamoTRIgine (LAMICTAL) 150 MG tablet Take by mouth.  .  Also, is allergic to hydrocodone; oxycodone; and zofran [ondansetron hcl]..  Review of Systems  Constitutional: Negative for chills, fever, malaise/fatigue and weight loss.  HENT: Negative for congestion, hearing loss and sinus pain.   Eyes: Negative for blurred vision and double vision.  Respiratory: Negative for cough, sputum production, shortness of breath and wheezing.   Cardiovascular: Negative for chest pain, palpitations, orthopnea and leg swelling.  Gastrointestinal: Negative for abdominal pain, constipation, diarrhea, nausea and vomiting.  Genitourinary: Negative for dysuria, flank pain, frequency, hematuria and urgency.  Musculoskeletal: Negative for back pain, falls and joint pain.  Skin: Negative for itching and rash.  Neurological: Negative for dizziness and headaches.  Psychiatric/Behavioral: Negative for depression, substance abuse and suicidal ideas. The patient is not nervous/anxious.     Physical Exam:  BP 110/74   Ht 5\' 6"  (1.676 m)   Wt 174 lb (78.9 kg)   BMI 28.08 kg/m  Body mass index is 28.08 kg/m. Constitutional: Well nourished, well developed female in no acute distress.  Abdomen: diffusely non tender to  palpation, non distended, and no masses, hernias Neuro: Grossly intact Psych:  Normal mood and affect.    Pelvic exam:  Two IUD strings present seen coming from the cervical os. EGBUS, vaginal vault and cervix: within normal limits  Assessment: IUD strings present in proper location; pt doing well  Plan: She was told to continue to use barrier contraception, in order to prevent any STIs, and to take a home pregnancy test or call us if she ever thinks she may be pregnant, and that her IUD expires in 5 years.  She was amenable to this plan and we will see her back in 1 year/PRN.  A total of 15 minutes were spent face-to-face with the patient during this encounter and over half of that time dealt with counseling and coordination of care.  Adrian Prows MD [facility], [facility] 09/02/2019 10:57 AM

## 2019-09-27 ENCOUNTER — Telehealth: Payer: Self-pay

## 2019-09-27 ENCOUNTER — Other Ambulatory Visit: Payer: Self-pay | Admitting: Obstetrics and Gynecology

## 2019-09-27 NOTE — Telephone Encounter (Signed)
Patient is calling to follow up on Care. Please advise. Patient aware CRS with patient's

## 2019-09-27 NOTE — Progress Notes (Signed)
Reports off and on pain. The last 3 days she has been having consistent pain.  Last night after intercourse the pain was worse.  She has not taken any medication for the pain.  Advised to take 600 mg ibuprofen every 6 hours for a week and see if this helps.  She was offered an [place] and appointment, but she does not want that right now because she can not afford any more expenses. She will follow up if the pain does not resolve.   Adrian Prows MD [facility], [facility] 09/27/2019 5:23 PM

## 2019-09-27 NOTE — Telephone Encounter (Signed)
Patient is calling to follow up on message left. Patient was last seen on 09/02/19. Please advise if patient needs an ultrasound for placement. Thank you.

## 2019-09-27 NOTE — Telephone Encounter (Signed)
Take 600mg  (3 pills) motrin every 6 hours for 7 day- can come to the office this week or next for an [place] and follow up

## 2019-09-27 NOTE — Telephone Encounter (Signed)
Called and spoke with patient about scheduling ultrasound appointment. Patient is just wanting to speak with some about her symptoms to find out if it's normal or not to be having cramping and discomfort. Patient doesn't wish to schedule ultrasound at this time. Patient was transferred to Dr. Gilman Schmidt for advisement.

## 2019-09-27 NOTE — Telephone Encounter (Signed)
Pt had IUD inserted 07/23/19; is experiencing cramping like it's poking her; it gets worse c position changed.  Is this normal?  It's been going on for the past couple days.  [phone]  Left detailed for pt to call and sched an appt.

## 2019-12-02 IMAGING — CT CT RENAL STONE PROTOCOL
3 of 4 series · 9 of 46 positions shown, 16 images · non-contrast
Comparison: 05/27/2017

CLINICAL DATA: Acute right flank pain

EXAM:
CT ABDOMEN AND PELVIS WITHOUT CONTRAST
TECHNIQUE: Multidetector CT imaging of the abdomen and pelvis was performed
following the standard protocol without IV contrast.

[Series 4: lung bases · axial · 0.65mm/px · z∈[-235,-155]mm · 5 of 24 slices shown, 10 images]
[im 4/24  soft-tissue]
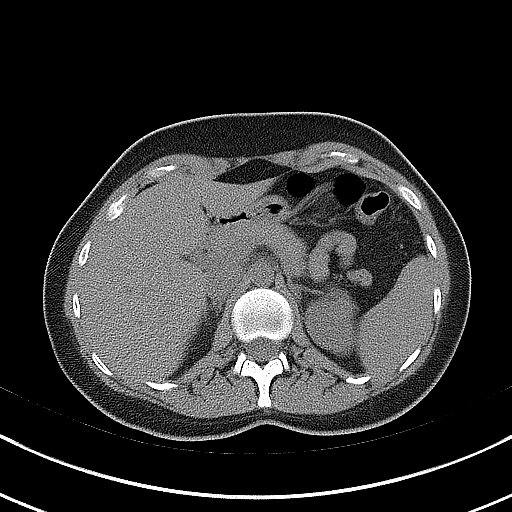
[im 4/24  bone]
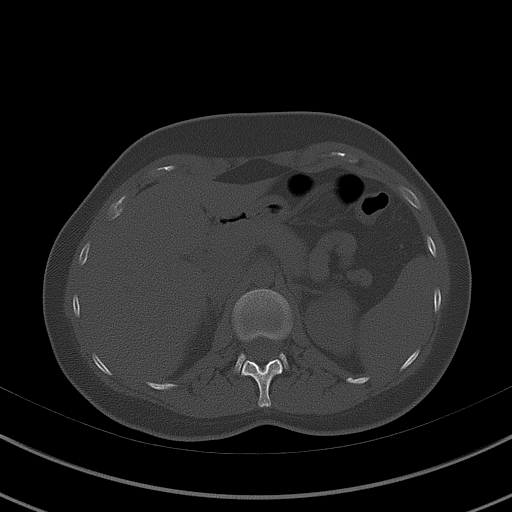
[im 8/24  soft-tissue]
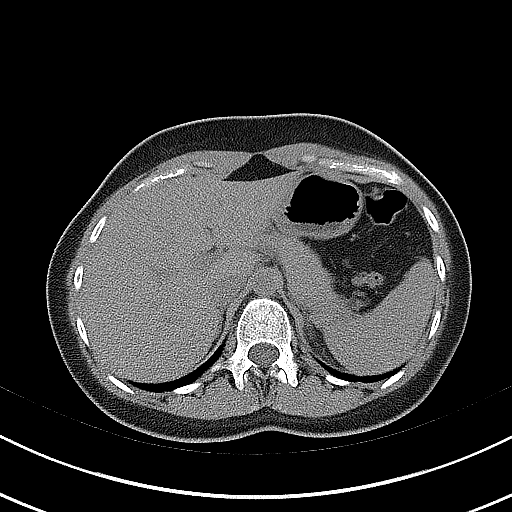
[im 8/24  lung]
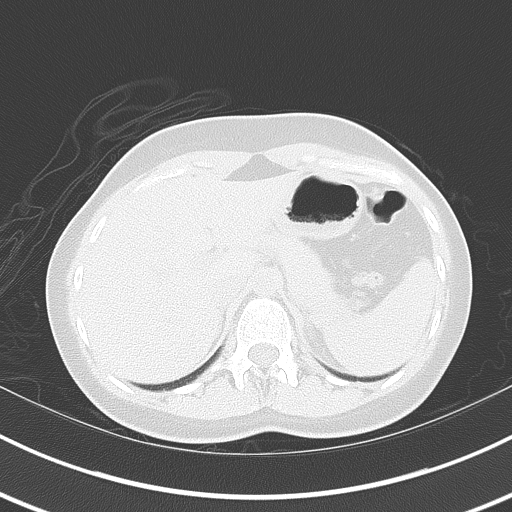
[im 12/24  soft-tissue]
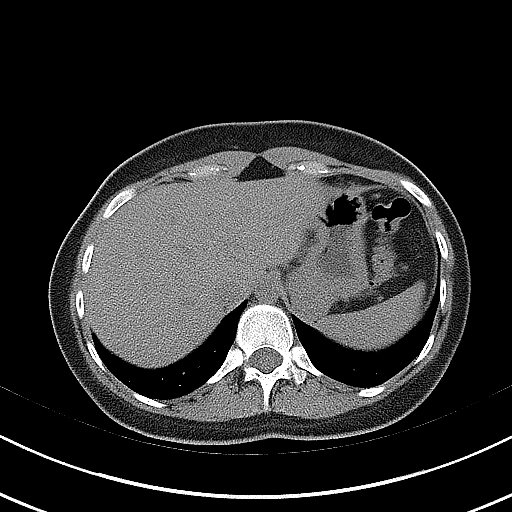
[im 12/24  lung]
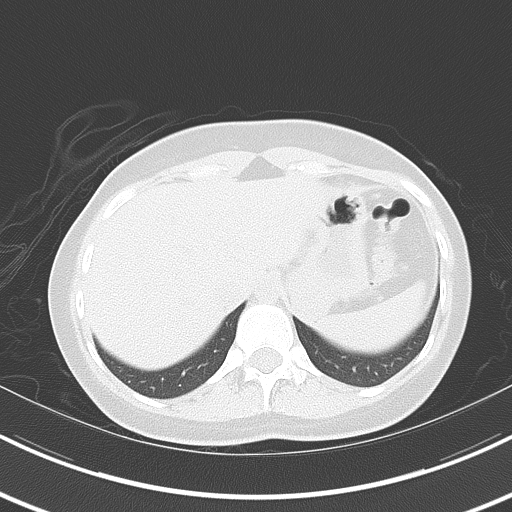
[im 16/24  soft-tissue]
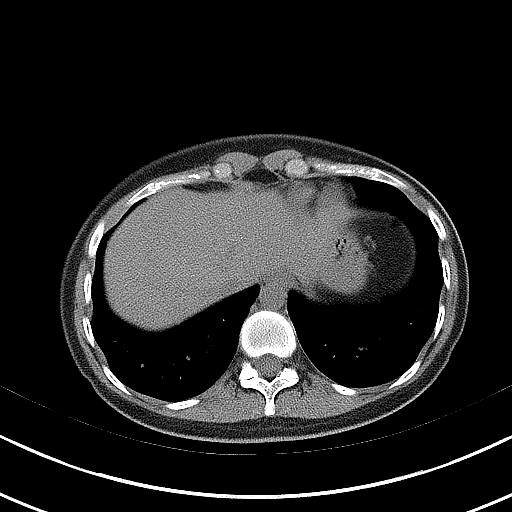
[im 16/24  lung]
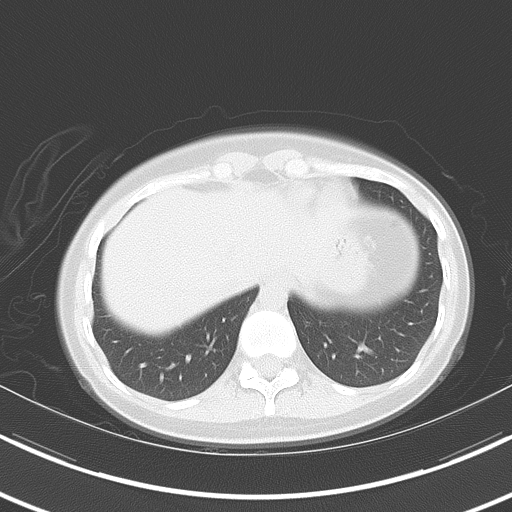
[im 20/24  soft-tissue]
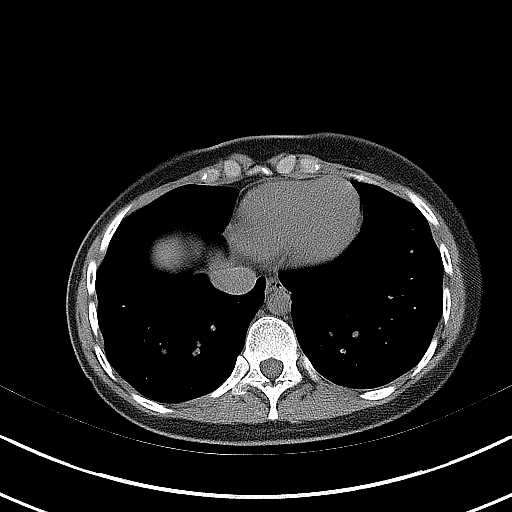
[im 20/24  lung]
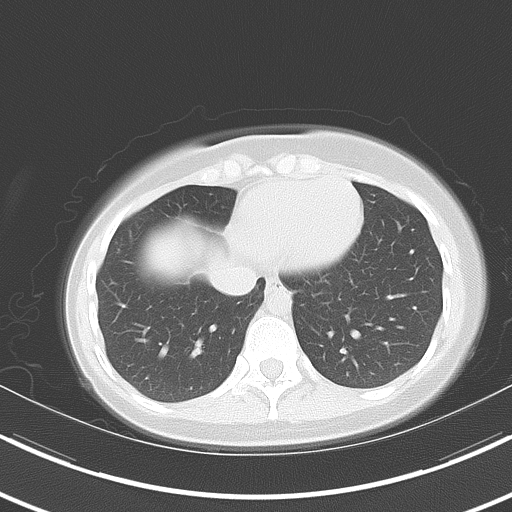

[Series 5: coronal · coronal · 0.64mm/px · 3 of 113 slices shown, 4 images]
[im 38/113  soft-tissue]
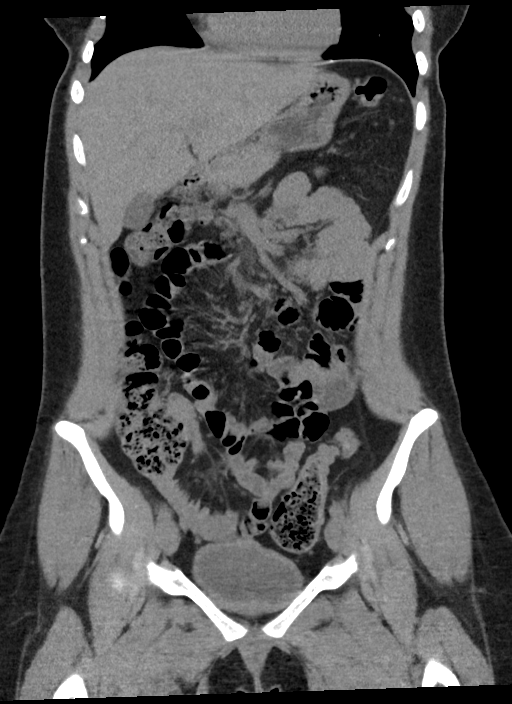
[im 50/113  soft-tissue]
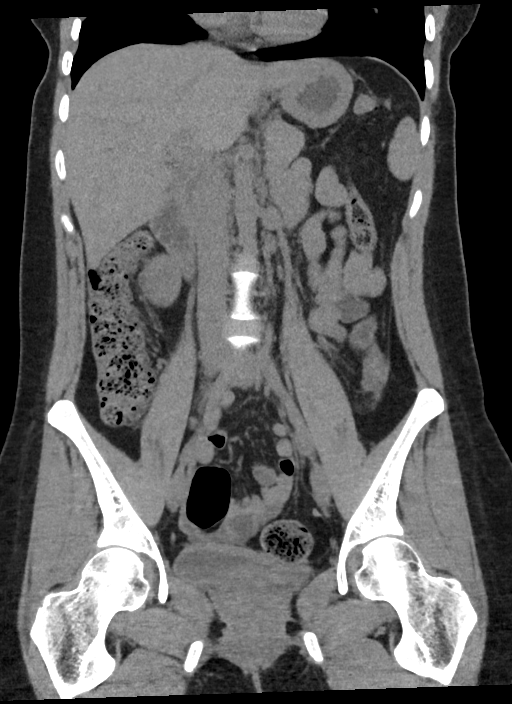
[im 50/113  bone]
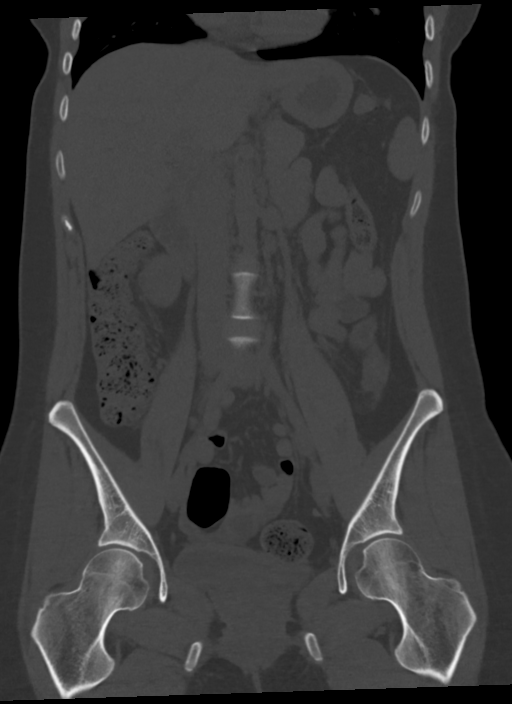
[im 63/113  soft-tissue]
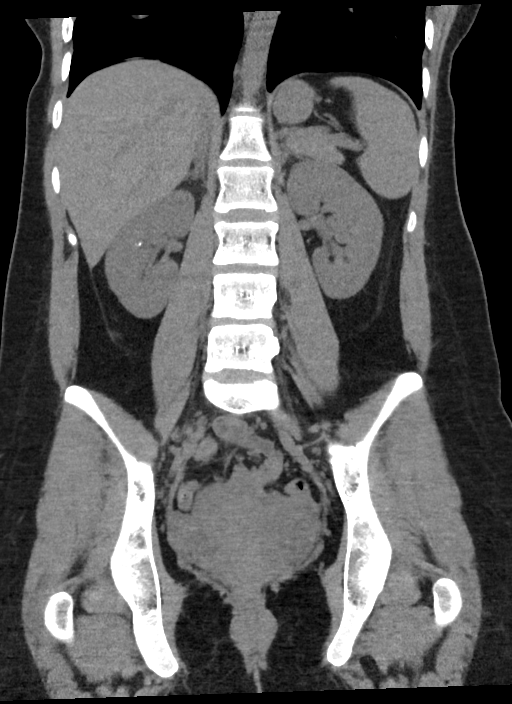

[Series 6: sagittal · sagittal · 0.48mm/px · 1 of 158 slices shown, 2 images]
[im 53/158  soft-tissue]
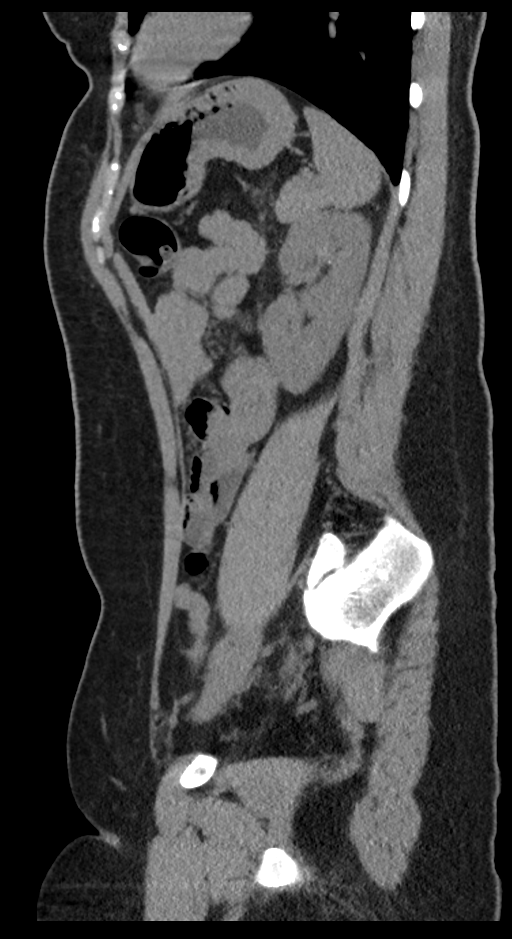
[im 53/158  bone]
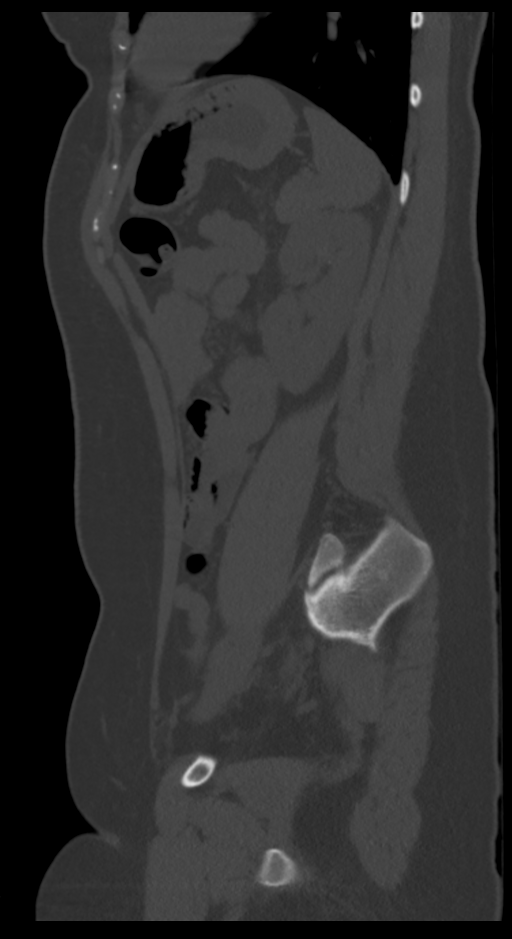

[9 of 46 positions shown; findings below may reference images not displayed]

FINDINGS: Lower chest: No acute abnormality.

Hepatobiliary: No focal liver abnormality is seen. No gallstones,
gallbladder wall thickening, or biliary dilatation.

Pancreas: Unremarkable. No pancreatic ductal dilatation or
surrounding inflammatory changes.

Spleen: Normal in size without focal abnormality.

Adrenals/Urinary Tract: Adrenal glands are within normal limits.
Kidneys are well visualized bilaterally and demonstrate scattered
nonobstructing renal calculi measuring 2-3 mm. No significant
obstructive changes are noted.

Stomach/Bowel: Stomach is within normal limits. Appendix appears
normal. No evidence of bowel wall thickening, distention, or
inflammatory changes.

Vascular/Lymphatic: No significant vascular findings are present. No
enlarged abdominal or pelvic lymph nodes.

Reproductive: Uterus and bilateral adnexa are unremarkable.

Other: No abdominal wall hernia or abnormality. No abdominopelvic
ascites.

Musculoskeletal: No acute or significant osseous findings.
IMPRESSION: Bilateral nonobstructing renal stones. No obstructive changes are
seen. No other focal abnormality is noted.

## 2020-02-09 DIAGNOSIS — S060X9A Concussion with loss of consciousness of unspecified duration, initial encounter: Secondary | ICD-10-CM

## 2020-02-09 DIAGNOSIS — S060XAA Concussion with loss of consciousness status unknown, initial encounter: Secondary | ICD-10-CM

## 2020-02-09 HISTORY — DX: Concussion with loss of consciousness of unspecified duration, initial encounter: S06.0X9A

## 2020-02-09 HISTORY — DX: Concussion with loss of consciousness status unknown, initial encounter: S06.0XAA

## 2020-02-14 ENCOUNTER — Emergency Department
Admission: EM | Admit: 2020-02-14 | Discharge: 2020-02-14 | Disposition: A | Discharge status: Home / Self Care | Payer: BC Managed Care – PPO | Attending: Emergency Medicine | Admitting: Emergency Medicine

## 2020-02-14 ENCOUNTER — Other Ambulatory Visit: Payer: Self-pay

## 2020-02-14 ENCOUNTER — Encounter: Payer: Self-pay | Admitting: Emergency Medicine

## 2020-02-14 DIAGNOSIS — R202 Paresthesia of skin: Secondary | ICD-10-CM | POA: Insufficient documentation

## 2020-02-14 DIAGNOSIS — Z87891 Personal history of nicotine dependence: Secondary | ICD-10-CM | POA: Insufficient documentation

## 2020-02-14 DIAGNOSIS — R42 Dizziness and giddiness: Secondary | ICD-10-CM | POA: Insufficient documentation

## 2020-02-14 LAB — URINALYSIS, COMPLETE (UACMP) WITH MICROSCOPIC
Bilirubin Urine: NEGATIVE
Glucose, UA: NEGATIVE mg/dL
Hgb urine dipstick: NEGATIVE
Ketones, ur: 5 mg/dL — AB
Leukocytes,Ua: NEGATIVE
Nitrite: NEGATIVE
Protein, ur: NEGATIVE mg/dL
Specific Gravity, Urine: 1.011 (ref 1.005–1.030)
pH: 5 (ref 5.0–8.0)

## 2020-02-14 LAB — BASIC METABOLIC PANEL
Anion gap: 13 (ref 5–15)
BUN: 10 mg/dL (ref 6–20)
CO2: 22 mmol/L (ref 22–32)
Calcium: 9.3 mg/dL (ref 8.9–10.3)
Chloride: 104 mmol/L (ref 98–111)
Creatinine, Ser: 0.55 mg/dL (ref 0.44–1.00)
GFR calc Af Amer: >60 mL/min (ref >60)
GFR calc non Af Amer: >60 mL/min (ref >60)
Glucose, Bld: 89 mg/dL (ref 70–99)
Potassium: 3.2 mmol/L — ABNORMAL LOW (ref 3.5–5.1)
Sodium: 139 mmol/L (ref 135–145)

## 2020-02-14 LAB — PREGNANCY, URINE: Preg Test, Ur: NEGATIVE

## 2020-02-14 LAB — CBC
HCT: 41.1 % (ref 36.0–46.0)
Hemoglobin: 13.6 g/dL (ref 12.0–15.0)
MCH: 29.9 pg (ref 26.0–34.0)
MCHC: 33.1 g/dL (ref 30.0–36.0)
MCV: 90.3 fL (ref 80.0–100.0)
Platelets: 379 10*3/uL (ref 150–400)
RBC: 4.55 MIL/uL (ref 3.87–5.11)
RDW: 13.1 % (ref 11.5–15.5)
WBC: 11.1 10*3/uL — ABNORMAL HIGH (ref 4.0–10.5)
nRBC: 0 % (ref 0.0–0.2)

## 2020-02-14 MED ORDER — MECLIZINE HCL 50 MG PO TABS: 50.0000 mg | ORAL_TABLET | Freq: Three times a day (TID) | ORAL | 0 refills | Status: DC | PRN

## 2020-02-14 MED ORDER — DIPHENHYDRAMINE HCL 25 MG PO TABS: 25.0000 mg | ORAL_TABLET | Freq: Four times a day (QID) | ORAL | 0 refills | Status: DC | PRN

## 2020-02-14 NOTE — ED Provider Notes (Signed)
EKG is reviewed inter by me at 1900 Normal sinus rhythm, heart rate 90 QRS 100 QTc 440  right bundle branch block.  No evidence acute ischemia   Sharyn Creamer, MD 02/14/20 [phone]

## 2020-02-14 NOTE — Discharge Instructions (Addendum)
Take Benadryl every 6 hours for dizziness. Meclizine can be taken every 8 hours for dizziness.

## 2020-02-14 NOTE — ED Triage Notes (Signed)
Pt in via POV, reports new onset light headedness after work this afternoon with some tingling to left hand.  Ambulatory to triage, NAD noted at this time.

## 2020-02-14 NOTE — ED Provider Notes (Signed)
Emergency Department Provider Note  ____________________________________________  Time seen: Approximately 7:22 PM  I have reviewed the triage vital signs and the nursing notes.   HISTORY  Chief Complaint Dizziness   Historian Patient     HPI Christina Case is a 24 y.o. female presents to the emergency department with new onset vertigo when patient lies supine.  She reports that sensation of vertigo improves when she sits up.  Patient states that she when she goes from a supine to sitting up to standing position, she can reproduce vertigo.  She states that she did have some tingling in her left hand after she noticed vertigo but states that tingling went away after she moved her hand.  Patient denies a history of cardiac issues.  She denies seizure-like activity.  No chest pain, chest tightness or abdominal pain.  She denies fever at home.  No sensation of palpitations.  No other alleviating measures have been attempted.   Past Medical History:  Diagnosis Date  . Migraine   . ODD (oppositional defiant disorder)   . Seizures ([place])      Immunizations up to date:  Yes.     Past Medical History:  Diagnosis Date  . Migraine   . ODD (oppositional defiant disorder)   . Seizures [facility])     Patient Active Problem List   Diagnosis Date Noted  . Epigastric pain 05/22/2017  . Other dysphagia 05/22/2017  . 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 PTSD (post-traumatic stress disorder) 07/15/2016  . OCD (obsessive compulsive disorder) 07/15/2016  . Suicidal ideation 07/15/2016  . 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 NO PAST SURGERIES      Prior to Admission medications   Medication Sig Start Date End Date Taking? Authorizing Provider  diphenhydrAMINE (BENADRYL ALLERGY) 25 MG tablet Take 1 tablet (25 mg total) by mouth every 6 (six) hours as needed. 02/14/20   Lannie Fields, PA-C  ibuprofen (ADVIL,MOTRIN) 600 MG tablet Take 1 tablet (600 mg total) by mouth every 8 (eight) hours as needed. Patient not taking: Reported on 07/22/2019 05/23/18   Darel Hong, MD  lamoTRIgine (LAMICTAL) 150 MG tablet Take by mouth. 07/02/19 07/01/20  [provider]  levonorgestrel (MIRENA) 20 MCG/24HR IUD 1 each by Intrauterine route once. 07/22/19   Schuman, Stefanie Libel, MD  meclizine (ANTIVERT) 50 MG tablet Take 1 tablet (50 mg total) by mouth 3 (three) times daily as needed. 02/14/20   Lannie Fields, PA-C    Allergies Hydrocodone, Oxycodone, and Zofran [ondansetron hcl]  Family History  Problem Relation Age of Onset  . Cancer Neg Hx   . Diabetes Neg Hx   . Hypertension Neg Hx   . Stroke Neg Hx   . Thyroid disease Neg Hx     Social History Social History   Tobacco Use  . Smoking status: Former Smoker    Types: Cigarettes  . Smokeless tobacco: Never Used  Substance Use Topics  . Alcohol use: Yes  . Drug use: No     Review of Systems  Constitutional: No fever/chills Eyes:  No discharge ENT: No upper respiratory complaints. Respiratory: no cough. No SOB/ use of accessory muscles to breath Gastrointestinal:   No nausea, no vomiting.  No diarrhea.  No constipation. Musculoskeletal: Negative for musculoskeletal pain. Neuro: Patient has vertigo.  Skin: Negative for rash, abrasions, lacerations, ecchymosis.    ____________________________________________   PHYSICAL EXAM:  VITAL SIGNS: ED Triage Vitals  Enc Vitals Group     BP 02/14/20 1802 (!) 144/69     Pulse Rate 02/14/20 1802 94     Resp 02/14/20 1802 19     Temp 02/14/20 1802 98.2 F (36.8 C)     Temp Source 02/14/20 1802 Oral     SpO2 02/14/20 1802 100 %     Weight 02/14/20 1812 174 lb (78.9 kg)     Height 02/14/20 1812 5\' 6"  (1.676 m)     Head Circumference --      Peak Flow --      Pain Score 02/14/20 1811 0     Pain Loc --      Pain Edu? --      Excl. in [place]? --       Constitutional: Alert and oriented. Well appearing and in no acute distress. Eyes: Conjunctivae are normal. PERRL. EOMI. Head: Atraumatic. ENT:      Ears:       Nose: No congestion/rhinnorhea.      Mouth/Throat: Mucous membranes are moist.  Neck: No stridor.  No cervical spine tenderness to palpation. Cardiovascular: Normal rate, regular rhythm. Normal S1 and S2.  Good peripheral circulation. Respiratory: Normal respiratory effort without tachypnea or retractions. Lungs CTAB. Good air entry to the bases with no decreased or absent breath sounds Gastrointestinal: Bowel sounds x 4 quadrants. Soft and nontender to palpation. No guarding or rigidity. No distention. Musculoskeletal: Full range of motion to all extremities. No obvious deformities noted Neurologic:  Normal for age. No gross focal neurologic deficits are appreciated.  Skin:  Skin is warm, dry and intact. No rash noted. Psychiatric: Mood and affect are normal for age. Speech and behavior are normal.   ____________________________________________   LABS (all labs ordered are listed, but only abnormal results are displayed)  Labs Reviewed  BASIC METABOLIC PANEL - Abnormal; Notable for the following components:      Result Value   Potassium 3.2 (*)    All other components within normal limits  CBC - Abnormal; Notable for the following components:   WBC 11.1 (*)    All other components within normal limits  URINALYSIS, COMPLETE (UACMP) WITH MICROSCOPIC - Abnormal; Notable for the following components:   Color, Urine YELLOW (*)    APPearance HAZY (*)    Ketones, ur 5 (*)    Bacteria, UA RARE (*)    All other components within normal limits  PREGNANCY, URINE   ____________________________________________  EKG   ____________________________________________  RADIOLOGY   No results found.  ____________________________________________    PROCEDURES  Procedure(s) performed:      Procedures     Medications - No data to display   ____________________________________________   INITIAL IMPRESSION / ASSESSMENT AND PLAN / ED COURSE  Pertinent labs & imaging results that were available during my care of the patient were reviewed by me and considered in my medical decision making (see chart for details).      Assessment and plan Benign Positional Vertigo  24 year old female presents to the emergency department with vertigo that started acutely today.  Vertigo can be reproduced with changes in position from a supine to sitting to standing position.  Neurologic exam was otherwise reassuring.  EKG revealed normal sinus rhythm without apparent arrhythmia.  Patient was discharged with Benadryl and meclizine.  Return precautions were given to return with new or worsening symptoms.  All patient questions were answered.     ____________________________________________  FINAL CLINICAL  IMPRESSION(S) / ED DIAGNOSES  Final diagnoses:  Dizziness      NEW MEDICATIONS STARTED DURING THIS VISIT:  ED Discharge Orders         Ordered    meclizine (ANTIVERT) 50 MG tablet  3 times daily PRN     02/14/20 1919    diphenhydrAMINE (BENADRYL ALLERGY) 25 MG tablet  Every 6 hours PRN     02/14/20 1919              This chart was dictated using voice recognition software/Dragon. Despite best efforts to proofread, errors can occur which can change the meaning. Any change was purely unintentional.     Orvil Feil, PA-C 02/14/20 1931    Phineas Semen, MD 02/14/20 [phone]

## 2020-02-18 ENCOUNTER — Emergency Department: Payer: BC Managed Care – PPO

## 2020-02-18 ENCOUNTER — Emergency Department
Admission: EM | Admit: 2020-02-18 | Discharge: 2020-02-18 | Disposition: A | Discharge status: Home / Self Care | Payer: BC Managed Care – PPO | Attending: Emergency Medicine | Admitting: Emergency Medicine

## 2020-02-18 ENCOUNTER — Other Ambulatory Visit: Payer: Self-pay

## 2020-02-18 ENCOUNTER — Encounter: Payer: Self-pay | Admitting: Emergency Medicine

## 2020-02-18 DIAGNOSIS — F0781 Postconcussional syndrome: Secondary | ICD-10-CM | POA: Insufficient documentation

## 2020-02-18 DIAGNOSIS — Z79899 Other long term (current) drug therapy: Secondary | ICD-10-CM | POA: Insufficient documentation

## 2020-02-18 DIAGNOSIS — R42 Dizziness and giddiness: Secondary | ICD-10-CM | POA: Diagnosis present

## 2020-02-18 DIAGNOSIS — R519 Headache, unspecified: Secondary | ICD-10-CM | POA: Insufficient documentation

## 2020-02-18 DIAGNOSIS — W2209XA Striking against other stationary object, initial encounter: Secondary | ICD-10-CM | POA: Insufficient documentation

## 2020-02-18 DIAGNOSIS — Z87891 Personal history of nicotine dependence: Secondary | ICD-10-CM | POA: Diagnosis not present

## 2020-02-18 MED ORDER — PREDNISONE 20 MG PO TABS: 20.0000 mg | ORAL_TABLET | Freq: Two times a day (BID) | ORAL | 0 refills | Status: AC

## 2020-02-18 MED ORDER — METOCLOPRAMIDE HCL 5 MG PO TABS: 5.0000 mg | ORAL_TABLET | Freq: Three times a day (TID) | ORAL | 0 refills | Status: DC | PRN

## 2020-02-18 NOTE — Discharge Instructions (Addendum)
Your exam and CT scan are normal at this time. Your symptoms are consistent with a mild post-concussive syndrome. You may experience several days of dizziness, fogginess, and nausea. Take the medicines as prescribed. Follow-up with your Neurologist for continued symptoms. Return as needed.

## 2020-02-18 NOTE — ED Provider Notes (Signed)
[facility] Emergency Department Provider Note ____________________________________________  Time seen: 1747  I have reviewed the triage vital signs and the nursing notes.  HISTORY  Chief Complaint  Dizziness  HPI Christina Case is a 24 y.o. female presents to the ED for evaluation of ongoing mild headache and dizziness. She denies any interim injury or fall. She has been taking the diphenhydramine as prescribed, but did not start the meclizine as directed. She also admits to a minor head injury in the two days prior to presentation on 3/29. She describes standing up under a large, heavy industrial shelf She reports ongoing nausea, headache, and vertigo. She denies any syncope, weakness, or chest pain.  Past Medical History:  Diagnosis Date  . Migraine   . ODD (oppositional defiant disorder)   . Seizures [facility])     Patient Active Problem List   Diagnosis Date Noted  . Epigastric pain 05/22/2017  . Other dysphagia 05/22/2017  . 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 PTSD (post-traumatic stress disorder) 07/15/2016  . OCD (obsessive compulsive disorder) 07/15/2016  . Suicidal ideation 07/15/2016  . 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 NO PAST SURGERIES      Prior to Admission medications   Medication Sig Start Date End Date Taking? Authorizing Provider  diphenhydrAMINE (BENADRYL ALLERGY) 25 MG tablet Take 1 tablet (25 mg total) by mouth every 6 (six) hours as needed. 02/14/20   Orvil Feil, PA-C  lamoTRIgine (LAMICTAL) 150 MG tablet Take by mouth. 07/02/19 07/01/20  [provider]  levonorgestrel (MIRENA) 20 MCG/24HR IUD 1 each by Intrauterine route once. 07/22/19   Schuman, [name], MD  meclizine (ANTIVERT) 50 MG tablet Take 1 tablet (50 mg total) by mouth 3 (three) times daily as needed. 02/14/20   Orvil Feil, PA-C  metoCLOPramide  (REGLAN) 5 MG tablet Take 1 tablet (5 mg total) by mouth every 8 (eight) hours as needed for up to 5 days for nausea or vomiting. 02/18/20 02/23/20  Jahnasia Tatum, [name], PA-C  predniSONE (DELTASONE) 20 MG tablet Take 1 tablet (20 mg total) by mouth 2 (two) times daily with a meal for 5 days. 02/18/20 02/23/20  Scotti Motter, [name], PA-C    Allergies Hydrocodone, Oxycodone, and Zofran [ondansetron hcl]  Family History  Problem Relation Age of Onset  . Cancer Neg Hx   . Diabetes Neg Hx   . Hypertension Neg Hx   . Stroke Neg Hx   . Thyroid disease Neg Hx     Social History Social History   Tobacco Use  . Smoking status: Former Smoker    Types: Cigarettes  . Smokeless tobacco: Never Used  Substance Use Topics  . Alcohol use: Yes  . Drug use: No    Review of Systems  Constitutional: Negative for fever. Eyes: Negative for visual changes. ENT: Negative for sore throat. Reports vertigo Cardiovascular: Negative for chest pain. Respiratory: Negative for shortness of breath. Gastrointestinal: Negative for abdominal pain, vomiting and diarrhea. Genitourinary: Negative for dysuria. Musculoskeletal: Negative for back pain. Skin: Negative for rash. Neurological: Negative for focal weakness or numbness. Reports headache ____________________________________________  PHYSICAL EXAM:  VITAL SIGNS: ED Triage Vitals  Enc Vitals Group     BP 02/18/20 1702 139/82     Pulse Rate 02/18/20 1702 (!) 108     Resp 02/18/20 1702 18     Temp 02/18/20 1702 98.3 F (  36.8 C)     Temp Source 02/18/20 1702 Oral     SpO2 02/18/20 1702 99 %     Weight 02/18/20 1703 173 lb 15.1 oz (78.9 kg)     Height 02/18/20 1703 5\' 6"  (1.676 m)     Head Circumference --      Peak Flow --      Pain Score 02/18/20 1703 3     Pain Loc --      Pain Edu? --      Excl. in [place]? --     Constitutional: Alert and oriented. Well appearing and in no distress. Head: Normocephalic and atraumatic. Eyes: Conjunctivae are  normal. PERRL. Normal extraocular movements and fundi bilaterally. Ears: Canals clear. TMs intact bilaterally. Nose: No congestion/rhinorrhea/epistaxis. Mouth/Throat: Mucous membranes are moist. Neck: Supple.  Normal range of motion without crepitus. Cardiovascular: Normal rate, regular rhythm. Normal distal pulses. Respiratory: Normal respiratory effort. No wheezes/rales/rhonchi. Gastrointestinal: Soft and nontender. No distention. Musculoskeletal: Nontender with normal range of motion in all extremities.  Neurologic: Cranial nerves II through XII grossly intact.  Normal gait without ataxia. Normal speech and language. No gross focal neurologic deficits are appreciated. Skin:  Skin is warm, dry and intact. No rash noted. Psychiatric: Mood and affect are normal. Patient exhibits appropriate insight and judgment. ____________________________________________   RADIOLOGY  CT Head w/o CM  IMPRESSION: No acute intracranial pathology. No non-contrast CT findings to explain headache. ____________________________________________  PROCEDURES  Procedures ____________________________________________  INITIAL IMPRESSION / ASSESSMENT AND PLAN / ED COURSE  Patient with ED evaluation of ongoing headache and vertigo intermittently since evaluation 1 week prior.  Patient was diagnosed with vertigo after she presented with symptoms consistent with BPV.  She presents today with concern over a preceding head injury.  CT scan is negative for any acute findings.  Symptoms likely represent a mild postconcussive syndrome with overlapping BPV.  Patient is overall reassured by her exam and CT exam findings.  Patient will be treated with steroids and antiemetics.  She is also encouraged to take the previously prescribed meclizine as directed.  She will follow-up with her neurologist for ongoing symptoms.  Return precautions have been reviewed.  Christina Case was evaluated in Emergency Department on 02/18/2020  for the symptoms described in the history of present illness. She was evaluated in the context of the global COVID-19 pandemic, which necessitated consideration that the patient might be at risk for infection with the SARS-CoV-2 virus that causes COVID-19. Institutional protocols and algorithms that pertain to the evaluation of patients at risk for COVID-19 are in a state of rapid change based on information released by regulatory bodies including the CDC and federal and state organizations. These policies and algorithms were followed during the patient's care in the ED. ____________________________________________  FINAL CLINICAL IMPRESSION(S) / ED DIAGNOSES  Final diagnoses:  Post concussion syndrome  Vertigo      Carmie End, Dannielle Karvonen, PA-C 02/18/20 1933    Nance Pear, MD 02/18/20 2133

## 2020-02-18 NOTE — ED Triage Notes (Signed)
Pt to ER states seen last week for dizziness and was diagnosed with vertigo.  States the week prior to that she had hit her head and forgot to tell them about it last week.  States area that she hit is still hurting. States she continues to have extreme nausea and vertigo symptoms.  States she is concerned with the head injury.  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

## 2020-02-18 NOTE — ED Notes (Signed)
Pt hit head on metal shelf at work, and experienced dizziness, and stinging pain in roof of mouth, upper lip. Pt has had stiffness in jaw, back and neck. Pt was treated at this ED for fluid in the ears x 3 days ago and has had intermittent vertigo since.

## 2020-02-18 NOTE — ED Triage Notes (Addendum)
FIRST NRUSE NOTE- pt here previously for fluid in ears and some vertigo. Came back today because she forgot to tell them she had fallen 2 weeks prior and hit head and now has had some headaches and thought she get rechecked.  NAD. Ambulatory.

## 2020-03-01 ENCOUNTER — Ambulatory Visit
Admission: RE | Admit: 2020-03-01 | Discharge: 2020-03-01 | Disposition: A | Discharge status: Home / Self Care | Payer: BC Managed Care – PPO | Source: Ambulatory Visit | Attending: Emergency Medicine | Admitting: Emergency Medicine

## 2020-03-01 ENCOUNTER — Ambulatory Visit
Admission: EM | Admit: 2020-03-01 | Discharge: 2020-03-01 | Disposition: A | Discharge status: Home / Self Care | Payer: BC Managed Care – PPO | Attending: Emergency Medicine | Admitting: Emergency Medicine

## 2020-03-01 ENCOUNTER — Other Ambulatory Visit: Payer: Self-pay

## 2020-03-01 DIAGNOSIS — M79661 Pain in right lower leg: Secondary | ICD-10-CM | POA: Insufficient documentation

## 2020-03-01 DIAGNOSIS — M791 Myalgia, unspecified site: Secondary | ICD-10-CM

## 2020-03-01 NOTE — ED Triage Notes (Addendum)
Pt presents with c/o leg cramps in the right calf for 2 days. Pt states she woke up the other night with an intense cramp in her calf and has had smaller cramps since then. Pt does report some pain with ambulation, denies redness/swelling to the leg. Pt has not taken any OTC meds for symptoms. Pt also reports some neck muscle strain/pain since having a concussion 3 weeks ago. Pt reports fluid in her ears is aggravating her concussion. Pt also reports stiffness in her jaw, thinks may be related to her wisdom teeth but is not sure.

## 2020-03-01 NOTE — Discharge Instructions (Addendum)
Go to [facility] for an ultrasound of your right calf.  You have a 4 pm appointment.  I will call you with the results.    Go to the Emergency Department right away if you develop shortness of breath.

## 2020-03-01 NOTE — ED Provider Notes (Addendum)
Christina Case    CSN: [id number] Arrival date & time: 03/01/20  1413      History   Chief Complaint Chief Complaint  Patient presents with  . leg cramps    right  . Neck Pain    HPI Christina Case is a 24 y.o. female.   Patient presents with right calf pain x2 days.  She describes the pain as cramping and "a cold spot", 3/10, worse with ambulation.  No difficulty breathing.  She denies redness, warmth, swelling of her calf.  No wounds or rash.  She denies numbness, tingling, weakness in her LE.  No recent falls or injury.  No recent prolonged inactivity.  Patient has a Mirena IUD.  She is not a current smoker.  Patient also reports ongoing muscle pain in her neck x 3 weeks.   She denies fever, chills, sore throat, cough, shortness of breath, vomiting, diarrhea, or other symptoms.    The history is provided by the patient.    Past Medical History:  Diagnosis Date  . Concussion 02/09/2020  . Migraine   . ODD (oppositional defiant disorder)   . Seizures [facility])     Patient Active Problem List   Diagnosis Date Noted  . Epigastric pain 05/22/2017  . Other dysphagia 05/22/2017  . 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 PTSD (post-traumatic stress disorder) 07/15/2016  . OCD (obsessive compulsive disorder) 07/15/2016  . Suicidal ideation 07/15/2016  . 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 NO PAST SURGERIES      OB History    Gravida  0   Para  0   Term  0   Preterm  0   AB  0   Living  0     SAB  0   TAB  0   Ectopic  0   Multiple  0   Live Births  0            Home Medications    Prior to Admission medications   Medication Sig Start Date End Date Taking? Authorizing Provider  diphenhydrAMINE (BENADRYL ALLERGY) 25 MG tablet Take 1 tablet (25 mg total) by mouth every 6 (six) hours as needed. 02/14/20  Yes [name], PA-C   lamoTRIgine (LAMICTAL) 150 MG tablet Take by mouth. 07/02/19 07/01/20 Yes [provider]  levonorgestrel (MIRENA) 20 MCG/24HR IUD 1 each by Intrauterine route once. 07/22/19  Yes Schuman, [name], MD  meclizine (ANTIVERT) 50 MG tablet Take 1 tablet (50 mg total) by mouth 3 (three) times daily as needed. 02/14/20   Orvil Feil, PA-C  metoCLOPramide (REGLAN) 5 MG tablet Take 1 tablet (5 mg total) by mouth every 8 (eight) hours as needed for up to 5 days for nausea or vomiting. 02/18/20 02/23/20  Menshew, [name], PA-C    Family History Family History  Problem Relation Age of Onset  . Diabetes Mother   . Rheum arthritis Mother   . Osteoarthritis Mother   . Cancer Neg Hx   . Hypertension Neg Hx   . Stroke Neg Hx   . Thyroid disease Neg Hx     Social History Social History   Tobacco Use  . Smoking status: Former Smoker    Types: Cigarettes  . Smokeless tobacco: Never Used  Substance Use Topics  . Alcohol use: Yes  . Drug use: No     Allergies  Hydrocodone, Oxycodone, and Zofran [ondansetron hcl]   Review of Systems Review of Systems  Constitutional: Negative for chills and fever.  HENT: Negative for congestion, ear pain, rhinorrhea and sore throat.   Eyes: Negative for pain and visual disturbance.  Respiratory: Negative for cough and shortness of breath.   Cardiovascular: Negative for chest pain, palpitations and leg swelling.  Gastrointestinal: Negative for abdominal pain, diarrhea, nausea and vomiting.  Genitourinary: Negative for dysuria and hematuria.  Musculoskeletal: Positive for myalgias. Negative for arthralgias and back pain.  Skin: Negative for color change and rash.  Neurological: Negative for seizures, syncope, weakness and numbness.  All other systems reviewed and are negative.    Physical Exam Triage Vital Signs ED Triage Vitals  Enc Vitals Group     BP 03/01/20 1426 119/78     Pulse Rate 03/01/20 1426 92     Resp 03/01/20 1426 18      Temp 03/01/20 1426 98.7 F (37.1 C)     Temp Source 03/01/20 1426 Oral     SpO2 03/01/20 1426 99 %     Weight 03/01/20 1422 169 lb (76.7 kg)     Height 03/01/20 1422 5\' 6"  (1.676 m)     Head Circumference --      Peak Flow --      Pain Score 03/01/20 1420 3     Pain Loc --      Pain Edu? --      Excl. in GC? --    No data found.  Updated Vital Signs BP 119/78 (BP Location: Left Arm)   Pulse 92   Temp 98.7 F (37.1 C) (Oral)   Resp 18   Ht 5\' 6"  (1.676 m)   Wt 169 lb (76.7 kg)   LMP 02/16/2020 (Approximate)   SpO2 99%   BMI 27.28 kg/m   Visual Acuity Right Eye Distance:   Left Eye Distance:   Bilateral Distance:    Right Eye Near:   Left Eye Near:    Bilateral Near:     Physical Exam Vitals and nursing note reviewed.  Constitutional:      General: She is not in acute distress.    Appearance: She is well-developed. She is not ill-appearing.  HENT:     Head: Normocephalic and atraumatic.     Right Ear: Tympanic membrane normal.     Left Ear: Tympanic membrane normal.     Nose: Nose normal.     Mouth/Throat:     Mouth: Mucous membranes are moist.     Pharynx: Oropharynx is clear.  Eyes:     Conjunctiva/sclera: Conjunctivae normal.  Cardiovascular:     Rate and Rhythm: Normal rate and regular rhythm.     Heart sounds: No murmur.  Pulmonary:     Effort: Pulmonary effort is normal. No respiratory distress.     Breath sounds: Normal breath sounds. No wheezing or rhonchi.  Abdominal:     General: Bowel sounds are normal.     Palpations: Abdomen is soft.     Tenderness: There is no abdominal tenderness. There is no guarding or rebound.  Musculoskeletal:        General: Tenderness present. No swelling or deformity. Normal range of motion.     Cervical back: Neck supple.     Comments: Mild tenderness to palpation of right calf.  Homan's negative.    Skin:    General: Skin is warm and dry.     Findings: No bruising, erythema, lesion or rash.  Neurological:      General: No focal deficit present.     Mental Status: She is alert and oriented to person, place, and time.     Gait: Gait normal.  Psychiatric:        Mood and Affect: Mood normal.        Behavior: Behavior normal.      UC Treatments / Results  Labs (all labs ordered are listed, but only abnormal results are displayed) Labs Reviewed - No data to display  EKG   Radiology US Venous Img Lower Unilateral Right (DVT)  Result Date: 03/01/2020 CLINICAL DATA:  Right calf pain. EXAM: Right LOWER EXTREMITY VENOUS DOPPLER ULTRASOUND TECHNIQUE: Gray-scale sonography with compression, as well as color and duplex ultrasound, were performed to evaluate the deep venous system(s) from the level of the common femoral vein through the popliteal and proximal calf veins. COMPARISON:  None. FINDINGS: VENOUS Normal compressibility of the common femoral, superficial femoral, and popliteal veins, as well as the visualized calf veins. Visualized portions of profunda femoral vein and great saphenous vein unremarkable. No filling defects to suggest DVT on grayscale or color Doppler imaging. Doppler waveforms show normal direction of venous flow, normal respiratory phasicity and response to augmentation. Limited views of the contralateral common femoral vein are unremarkable. OTHER None. Limitations: none IMPRESSION: No femoropopliteal DVT nor evidence of DVT within the visualized calf veins. If clinical symptoms are inconsistent or if there are persistent or worsening symptoms, further imaging (possibly involving the iliac veins) may be warranted. Electronically Signed   By: Marijo Conception M.D.   On: 03/01/2020 16:47    Procedures Procedures (including critical care time)  Medications Ordered in UC Medications - No data to display  Initial Impression / Assessment and Plan / UC Course  I have reviewed the triage vital signs and the nursing notes.  Pertinent labs & imaging results that were available during  my care of the patient were reviewed by me and considered in my medical decision making (see chart for details).   Right calf pain. Myalgias.  Ultrasound of right calf negative.  Instructed patient to take Tylenol or ibuprofen as needed for discomfort and to follow up with PCP if symptoms persist.  Patient agrees to plan of care.     Final Clinical Impressions(s) / UC Diagnoses   Final diagnoses:  Right calf pain  Myalgia     Discharge Instructions     Go to [facility] for an ultrasound of your right calf.  You have a 4 pm appointment.  I will call you with the results.    Go to the Emergency Department right away if you develop shortness of breath.     ED Prescriptions    None     I have reviewed the PDMP during this encounter.   Sharion Balloon, NP 03/01/20 [place], [place], NP 03/01/20 1655

## 2020-04-24 ENCOUNTER — Encounter: Payer: Self-pay | Admitting: Emergency Medicine

## 2020-04-24 ENCOUNTER — Emergency Department
Admission: EM | Admit: 2020-04-24 | Discharge: 2020-04-24 | Disposition: A | Discharge status: Home / Self Care | Payer: BC Managed Care – PPO | Attending: Emergency Medicine | Admitting: Emergency Medicine

## 2020-04-24 ENCOUNTER — Other Ambulatory Visit: Payer: Self-pay

## 2020-04-24 DIAGNOSIS — Z79899 Other long term (current) drug therapy: Secondary | ICD-10-CM | POA: Diagnosis not present

## 2020-04-24 DIAGNOSIS — R202 Paresthesia of skin: Secondary | ICD-10-CM | POA: Insufficient documentation

## 2020-04-24 MED ORDER — LEVETIRACETAM 250 MG PO TABS
250.0000 mg | ORAL_TABLET | Freq: Once | ORAL | Status: AC
  Administered 2020-04-24: 250 mg via ORAL
  Filled 2020-04-24: qty 1

## 2020-04-24 NOTE — ED Notes (Signed)
Pt states coming in because her extremities at times go cold and numb. Pt states history of seizures and states being off her medications and is supposed to start keppra, but has not due to a delay with pharmacy.

## 2020-04-24 NOTE — ED Triage Notes (Addendum)
Pt presents to ED with multiple concerns. Pt reports the past few hours her "hands get cold and have less feeling in them but only when lying down". Pt states it is "Like having dizziness in just my arms". Legs feel "lighter and have tingling". Pt also reports her nose "has been feeling tender when I touch it but now it feels normal and kind of runny" and her "belly button piercing from about a month ago might be getting infected because it looks aggravated and is painful to the touch". No swelling or drainage noted.    Pt states she is coming off Lamictal and getting ready to start Keppra and unsure if her symptoms are related to the medication changes.

## 2020-04-24 NOTE — ED Provider Notes (Signed)
[facility] Emergency Department Provider Note ____________________________________________  Time seen: 2219  I have reviewed the triage vital signs and the nursing notes.  HISTORY  Chief Complaint  Hand Problem  HPI Christina Case is a 24 y.o. female presents herself to the ED for evaluation of paresthesias to the hands.   Patient complains of dizziness in her arms and hands after she has been in the process of assisting off of her Lamictal and plans on starting Keppra at the advice of her neurologist.  She reportedly took her last dose of Lamictal 2 nights prior, and took 25 mg at that time.  She did reach out to [facility] neurology of [place], via phone and was advised to take the Lamictal tonight and transition to half tab for the next several nights before attempting to stop the tablets completely.  Patient without any complaints of paralysis, weakness, syncope, facial droop, or visual disturbance.  Patient is not inclined to continue to taper the Lamictal.  She is requesting a single dose of Keppra which was to be available today, but has been delayed until tomorrow according to her note from the pharmacist.    Past Medical History:  Diagnosis Date  . Concussion 02/09/2020  . Migraine   . ODD (oppositional defiant disorder)   . Seizures [facility])     Patient Active Problem List   Diagnosis Date Noted  . Epigastric pain 05/22/2017  . Other dysphagia 05/22/2017  . Sedative abuse ([place]) 01/22/2017  . PTSD (post-traumatic stress disorder) 07/15/2016  . OCD (obsessive compulsive disorder) 07/15/2016  . Suicidal ideation 07/15/2016  . Major depressive disorder, recurrent severe without psychotic features ([place]) 07/14/2016    Past Surgical History:  Procedure Laterality Date  . NO PAST SURGERIES      Prior to Admission medications   Medication Sig Start Date End Date Taking? Authorizing Provider  diphenhydrAMINE (BENADRYL ALLERGY) 25 MG tablet Take 1 tablet (25 mg  total) by mouth every 6 (six) hours as needed. 02/14/20   Lannie Fields, PA-C  lamoTRIgine (LAMICTAL) 150 MG tablet Take by mouth. 07/02/19 07/01/20  [provider]  levonorgestrel (MIRENA) 20 MCG/24HR IUD 1 each by Intrauterine route once. 07/22/19   Schuman, Stefanie Libel, MD  meclizine (ANTIVERT) 50 MG tablet Take 1 tablet (50 mg total) by mouth 3 (three) times daily as needed. 02/14/20   Lannie Fields, PA-C  metoCLOPramide (REGLAN) 5 MG tablet Take 1 tablet (5 mg total) by mouth every 8 (eight) hours as needed for up to 5 days for nausea or vomiting. 02/18/20 02/23/20  Lama Narayanan, Dannielle Karvonen, PA-C    Allergies Hydrocodone, Oxycodone, and Zofran [ondansetron hcl]  Family History  Problem Relation Age of Onset  . Diabetes Mother   . Rheum arthritis Mother   . Osteoarthritis Mother   . Cancer Neg Hx   . Hypertension Neg Hx   . Stroke Neg Hx   . Thyroid disease Neg Hx     Social History Social History   Tobacco Use  . Smoking status: Never Smoker  . Smokeless tobacco: Never Used  Substance Use Topics  . Alcohol use: Yes  . Drug use: No    Review of Systems  Constitutional: Negative for fever. Eyes: Negative for visual changes. ENT: Negative for sore throat. Cardiovascular: Negative for chest pain. Respiratory: Negative for shortness of breath. Gastrointestinal: Negative for abdominal pain, vomiting and diarrhea. Genitourinary: Negative for dysuria. Musculoskeletal: Negative for back pain. Skin: Negative for rash. Neurological:  Negative for headaches, focal weakness or numbness.  Hand paresthesias as above. ____________________________________________  PHYSICAL EXAM:  VITAL SIGNS: ED Triage Vitals  Enc Vitals Group     BP 04/24/20 2038 137/84     Pulse Rate 04/24/20 2038 90     Resp 04/24/20 2038 18     Temp 04/24/20 2038 98.7 F (37.1 C)     Temp Source 04/24/20 2038 Oral     SpO2 04/24/20 2038 100 %     Weight 04/24/20 2038 168 lb (76.2 kg)     Height  04/24/20 2038 5\' 6"  (1.676 m)     Head Circumference --      Peak Flow --      Pain Score 04/24/20 2051 4     Pain Loc --      Pain Edu? --      Excl. in GC? --     Constitutional: Alert and oriented. Well appearing and in no distress.  GCS equals 15. Head: Normocephalic and atraumatic. Eyes: Conjunctivae are normal. Normal extraocular movements Cardiovascular: Normal rate, regular rhythm. Normal distal pulses. Respiratory: Normal respiratory effort. No wheezes/rales/rhonchi. Musculoskeletal: Nontender with normal range of motion in all extremities.  Neurologic: Nerves II through XII grossly intact.  Normal gait without ataxia. Normal speech and language. No gross focal neurologic deficits are appreciated. Skin:  Skin is warm, dry and intact. No rash noted. Psychiatric: Mood and affect are normal. Patient exhibits appropriate insight and judgment. ____________________________________________  PROCEDURES  Keppra 250 mg PO  Procedures ____________________________________________  INITIAL IMPRESSION / ASSESSMENT AND PLAN / ED COURSE  Patient with ED evaluation of concern over some paresthesias while she was attempting to taper off her Lamictal and start Keppra according to her neurologist recommendation.  Patient with resolved paresthesias to the hands and legs bilaterally.  She is denying any facial weakness, droop, syncope, or paralysis.  She was given a single dose of Keppra to 50 mg at her request.  She remained in the ED for 20 minutes for observation and denied any symptoms.  She is discharged at this time follow with a neurologist as planned.  Prescription will be available from her neurologist tomorrow as expected.  [name] was evaluated in Emergency Department on 04/25/2020 for the symptoms described in the history of present illness. She was evaluated in the context of the global COVID-19 pandemic, which necessitated consideration that the patient might be at risk for  infection with the SARS-CoV-2 virus that causes COVID-19. Institutional protocols and algorithms that pertain to the evaluation of patients at risk for COVID-19 are in a state of rapid change based on information released by regulatory bodies including the CDC and federal and state organizations. These policies and algorithms were followed during the patient's care in the ED. ____________________________________________  FINAL CLINICAL IMPRESSION(S) / ED DIAGNOSES  Final diagnoses:  Paresthesia of both hands      06/25/2020, Karmen Stabs, PA-C 04/25/20 0008    06/25/20, MD 04/25/20 2256

## 2020-05-04 ENCOUNTER — Emergency Department: Payer: BC Managed Care – PPO

## 2020-05-04 ENCOUNTER — Other Ambulatory Visit: Payer: Self-pay

## 2020-05-04 ENCOUNTER — Emergency Department
Admission: EM | Admit: 2020-05-04 | Discharge: 2020-05-04 | Disposition: A | Discharge status: Home / Self Care | Payer: BC Managed Care – PPO | Attending: Emergency Medicine | Admitting: Emergency Medicine

## 2020-05-04 ENCOUNTER — Encounter: Payer: Self-pay | Admitting: Emergency Medicine

## 2020-05-04 DIAGNOSIS — R109 Unspecified abdominal pain: Secondary | ICD-10-CM

## 2020-05-04 DIAGNOSIS — N200 Calculus of kidney: Secondary | ICD-10-CM | POA: Insufficient documentation

## 2020-05-04 DIAGNOSIS — Z87891 Personal history of nicotine dependence: Secondary | ICD-10-CM | POA: Insufficient documentation

## 2020-05-04 DIAGNOSIS — R102 Pelvic and perineal pain: Secondary | ICD-10-CM

## 2020-05-04 DIAGNOSIS — Z79899 Other long term (current) drug therapy: Secondary | ICD-10-CM | POA: Diagnosis not present

## 2020-05-04 DIAGNOSIS — R1032 Left lower quadrant pain: Secondary | ICD-10-CM | POA: Diagnosis present

## 2020-05-04 LAB — URINALYSIS, COMPLETE (UACMP) WITH MICROSCOPIC
Bilirubin Urine: NEGATIVE
Glucose, UA: NEGATIVE mg/dL
Ketones, ur: NEGATIVE mg/dL
Leukocytes,Ua: NEGATIVE
Nitrite: NEGATIVE
Protein, ur: NEGATIVE mg/dL
RBC / HPF: >50 RBC/hpf — ABNORMAL HIGH (ref 0–5)
Specific Gravity, Urine: 1.017 (ref 1.005–1.030)
pH: 8 (ref 5.0–8.0)

## 2020-05-04 LAB — CBC
HCT: 42.3 % (ref 36.0–46.0)
Hemoglobin: 14.7 g/dL (ref 12.0–15.0)
MCH: 30.2 pg (ref 26.0–34.0)
MCHC: 34.8 g/dL (ref 30.0–36.0)
MCV: 86.9 fL (ref 80.0–100.0)
Platelets: 333 10*3/uL (ref 150–400)
RBC: 4.87 MIL/uL (ref 3.87–5.11)
RDW: 12.5 % (ref 11.5–15.5)
WBC: 7.9 10*3/uL (ref 4.0–10.5)
nRBC: 0 % (ref 0.0–0.2)

## 2020-05-04 LAB — COMPREHENSIVE METABOLIC PANEL
ALT: 12 U/L (ref 0–44)
AST: 18 U/L (ref 15–41)
Albumin: 4.3 g/dL (ref 3.5–5.0)
Alkaline Phosphatase: 133 U/L — ABNORMAL HIGH (ref 38–126)
Anion gap: 9 (ref 5–15)
BUN: 10 mg/dL (ref 6–20)
CO2: 24 mmol/L (ref 22–32)
Calcium: 9.8 mg/dL (ref 8.9–10.3)
Chloride: 106 mmol/L (ref 98–111)
Creatinine, Ser: 0.66 mg/dL (ref 0.44–1.00)
GFR calc Af Amer: >60 mL/min (ref >60)
GFR calc non Af Amer: >60 mL/min (ref >60)
Glucose, Bld: 92 mg/dL (ref 70–99)
Potassium: 4.1 mmol/L (ref 3.5–5.1)
Sodium: 139 mmol/L (ref 135–145)
Total Bilirubin: 0.8 mg/dL (ref 0.3–1.2)
Total Protein: 7.8 g/dL (ref 6.5–8.1)

## 2020-05-04 LAB — LIPASE, BLOOD: Lipase: 39 U/L (ref 11–51)

## 2020-05-04 LAB — POCT PREGNANCY, URINE: Preg Test, Ur: NEGATIVE

## 2020-05-04 MED ORDER — IBUPROFEN 400 MG PO TABS
400.0000 mg | ORAL_TABLET | Freq: Once | ORAL | Status: DC | PRN
  Filled 2020-05-04: qty 1

## 2020-05-04 MED ORDER — PROMETHAZINE HCL 12.5 MG PO TABS: 12.5000 mg | ORAL_TABLET | Freq: Four times a day (QID) | ORAL | 0 refills | Status: DC | PRN

## 2020-05-04 MED ORDER — SODIUM CHLORIDE 0.9 % IV BOLUS
1000.0000 mL | Freq: Once | INTRAVENOUS | Status: AC
  Administered 2020-05-04: 1000 mL via INTRAVENOUS

## 2020-05-04 MED ORDER — METOCLOPRAMIDE HCL 5 MG/ML IJ SOLN
10.0000 mg | Freq: Once | INTRAMUSCULAR | Status: AC
  Administered 2020-05-04: 10 mg via INTRAVENOUS
  Filled 2020-05-04: qty 2

## 2020-05-04 MED ORDER — KETOROLAC TROMETHAMINE 10 MG PO TABS: 10.0000 mg | ORAL_TABLET | Freq: Four times a day (QID) | ORAL | 0 refills | Status: DC | PRN

## 2020-05-04 MED ORDER — KETOROLAC TROMETHAMINE 30 MG/ML IJ SOLN
30.0000 mg | Freq: Once | INTRAMUSCULAR | Status: AC
  Administered 2020-05-04: 30 mg via INTRAVENOUS
  Filled 2020-05-04: qty 1

## 2020-05-04 NOTE — ED Triage Notes (Signed)
Patient presents to the ED with left lower quadrant abdominal pain that woke patient out of her sleep.  Patient is complaining of nausea, denies vomiting and diarrhea.  Patient is sobbing during triage.  Patient denies similar pain in the past.

## 2020-05-04 NOTE — ED Notes (Signed)
Discussed patient with Dr. Erma Heritage.  Dr. Erma Heritage gave verbal orders for [place] and Norco.  When this RN discussed Norco with patient, she states that hydrocodone causes her to feel very weird and dizzy.

## 2020-05-04 NOTE — Discharge Instructions (Signed)
You have a 4 mm stone in the left lower ureter.  You may take the Toradol and Phenergan as needed for your pain and nausea.  Return to the ER for new, worsening, or persistent severe pain, vomiting, fever, weakness, or any other new or worsening symptoms that concern you.

## 2020-05-04 NOTE — ED Provider Notes (Signed)
[facility] Emergency Department Provider Note ____________________________________________   First MD Initiated Contact with Patient 05/04/20 1412     (approximate)  I have reviewed the triage vital signs and the nursing notes.   HISTORY  Chief Complaint Abdominal Pain    HPI Christina Case is a 24 y.o. female with PMH as noted below who presents with left lower quadrant abdominal pain, acute onset early this morning, associated with left-sided low back pain as well as with nausea and vomiting.  The patient denies any diarrhea.  She had a relatively normal bowel movement this morning.  She denies any vaginal bleeding or discharge but states she has some vague vaginal discomfort.  She reports dark urine but no dysuria or frequency.  She has a previous history of kidney stones but states that she has never had pain this severe.  Past Medical History:  Diagnosis Date  . Concussion 02/09/2020  . Migraine   . ODD (oppositional defiant disorder)   . Seizures [facility])     Patient Active Problem List   Diagnosis Date Noted  . Epigastric pain 05/22/2017  . Other dysphagia 05/22/2017  . 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 PTSD (post-traumatic stress disorder) 07/15/2016  . OCD (obsessive compulsive disorder) 07/15/2016  . Suicidal ideation 07/15/2016  . 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 NO PAST SURGERIES      Prior to Admission medications   Medication Sig Start Date End Date Taking? Authorizing Provider  diphenhydrAMINE (BENADRYL ALLERGY) 25 MG tablet Take 1 tablet (25 mg total) by mouth every 6 (six) hours as needed. 02/14/20   Lannie Fields, PA-C  ketorolac (TORADOL) 10 MG tablet Take 1 tablet (10 mg total) by mouth every 6 (six) hours as needed. 05/04/20   Arta Silence, MD  lamoTRIgine (LAMICTAL) 150 MG tablet Take by mouth. 07/02/19  07/01/20  [provider]  levonorgestrel (MIRENA) 20 MCG/24HR IUD 1 each by Intrauterine route once. 07/22/19   Schuman, Stefanie Libel, MD  meclizine (ANTIVERT) 50 MG tablet Take 1 tablet (50 mg total) by mouth 3 (three) times daily as needed. 02/14/20   Lannie Fields, PA-C  metoCLOPramide (REGLAN) 5 MG tablet Take 1 tablet (5 mg total) by mouth every 8 (eight) hours as needed for up to 5 days for nausea or vomiting. 02/18/20 02/23/20  Menshew, Dannielle Karvonen, PA-C  promethazine (PHENERGAN) 12.5 MG tablet Take 1 tablet (12.5 mg total) by mouth every 6 (six) hours as needed for nausea or vomiting. 05/04/20   Arta Silence, MD    Allergies Hydrocodone, Oxycodone, and Zofran [facility]]  Family History  Problem Relation Age of Onset  . Diabetes Mother   . Rheum arthritis Mother   . Osteoarthritis Mother   . Cancer Neg Hx   . Hypertension Neg Hx   . Stroke Neg Hx   . Thyroid disease Neg Hx     Social History Social History   Tobacco Use  . Smoking status: Never Smoker  . Smokeless tobacco: Never Used  Vaping Use  . Vaping Use: Former  Substance Use Topics  . Alcohol use: Yes  . Drug use: No    Review of Systems  Constitutional: No fever/chills. Eyes: No redness. ENT: No sore throat. Cardiovascular: Denies chest pain. Respiratory: Denies shortness of breath. Gastrointestinal: Positive for nausea and vomiting. Genitourinary: Positive for dark urine. Musculoskeletal: Positive for back pain.  Skin: Negative for rash. Neurological: Negative for headache.   ____________________________________________   PHYSICAL EXAM:  VITAL SIGNS: ED Triage Vitals  Enc Vitals Group     BP 05/04/20 1103 120/90     Pulse Rate 05/04/20 1103 86     Resp 05/04/20 1103 18     Temp 05/04/20 1103 98.1 F (36.7 C)     Temp Source 05/04/20 1103 Oral     SpO2 05/04/20 1103 99 %     Weight 05/04/20 1059 160 lb (72.6 kg)     Height 05/04/20 1059 5\' 4"  (1.626 m)     Head  Circumference --      Peak Flow --      Pain Score 05/04/20 1058 10     Pain Loc --      Pain Edu? --      Excl. in GC? --     Constitutional: Alert and oriented.  Uncomfortable appearing and intermittently tearful, but redirectable.  Eyes: Conjunctivae are normal.  No scleral icterus. Head: Atraumatic. Nose: No congestion/rhinnorhea. Mouth/Throat: Mucous membranes are moist.   Neck: Normal range of motion.  Cardiovascular: Normal rate, regular rhythm. Good peripheral circulation. Respiratory: Normal respiratory effort.  No retractions.  Gastrointestinal: Soft with minimal left lower quadrant tenderness.  No distention.  Genitourinary: Mild left CVA tenderness. Musculoskeletal: No lower extremity edema.  Extremities warm and well perfused.  Neurologic:  Normal speech and language. No gross focal neurologic deficits are appreciated.  Skin:  Skin is warm and dry. No rash noted. Psychiatric: Very anxious appearing.  ____________________________________________   LABS (all labs ordered are listed, but only abnormal results are displayed)  Labs Reviewed  COMPREHENSIVE METABOLIC PANEL - Abnormal; Notable for the following components:      Result Value   Alkaline Phosphatase 133 (*)    All other components within normal limits  URINALYSIS, COMPLETE (UACMP) WITH MICROSCOPIC - Abnormal; Notable for the following components:   Color, Urine YELLOW (*)    APPearance CLOUDY (*)    Hgb urine dipstick LARGE (*)    RBC / HPF >50 (*)    Bacteria, UA RARE (*)    All other components within normal limits  LIPASE, BLOOD  CBC  POC URINE PREG, ED  POCT PREGNANCY, URINE   ____________________________________________  EKG   ____________________________________________  RADIOLOGY  05/06/20 pelvis: Right ovary normal.  Left ovary with normal color Doppler; waveforms unable to be obtained due to patient's combative nature. CT renal stone study: 4 mm stone at the left UVJ with mild  hydronephrosis  ____________________________________________   PROCEDURES  Procedure(s) performed: No  Procedures  Critical Care performed: No ____________________________________________   INITIAL IMPRESSION / ASSESSMENT AND PLAN / ED COURSE  Pertinent labs & imaging results that were available during my care of the patient were reviewed by me and considered in my medical decision making (see chart for details).  24 year old female with PMH as noted above presents with acute onset of left lower quadrant and left low back pain around 630 this morning.  She reports associated nausea and vomiting, and has had dark urine recently.  She states that she has had kidney stones before but without pain is severe.  On exam she is very uncomfortable appearing.  Her vital signs are normal.  The abdomen is soft with minimal left lower quadrant tenderness and no peritoneal signs.  She has mild left CVA tenderness.  Initial lab work-up is unremarkable, except that her urinalysis shows significant RBCs.  Pelvic  ultrasound was obtained from triage.  It is within normal limits, however evaluation with waveform Doppler of the left ovary was limited because the patient was in severe pain and per the radiology report was somewhat combative during the exam.  The patient appears extremely anxious and was immediately yelling at me to tell her what was wrong and give medications when I entered the room, however after speaking to her for a few minutes I was able to verbally redirect her and she could fully cooperate with exam.  Overall I suspect most likely ureteral stone.  Differential also includes colitis or diverticulitis.  Based on the urinalysis there is no evidence of pyelonephritis.  I have a lower suspicion for gynecologic etiology.  Although the pelvic ultrasound is limited, given the location of the patient's pain and the lack of any significant ovarian cysts I have a very low suspicion for  torsion.  We will obtain a CT of the abdomen.  If this is negative for acute findings I will consider further gynecologic evaluation.  ----------------------------------------- 4:08 PM on 05/04/2020 -----------------------------------------  CT shows a 4 mm stone at the left UVJ which is consistent with the patient's presentation.  Given this finding, there is no evidence of gynecologic etiology.  She reports significant improvement with Toradol and Reglan.  She would like to go home.  At this time, she is stable for discharge.  I counseled her on the results of the work-up.  I have prescribed oral Toradol and Phenergan which has worked well before for the patient.  Return precautions given, and the patient expressed understanding. ____________________________________________   FINAL CLINICAL IMPRESSION(S) / ED DIAGNOSES  Final diagnoses:  Sudden onset of severe abdominal pain  Kidney stone      NEW MEDICATIONS STARTED DURING THIS VISIT:  New Prescriptions   KETOROLAC (TORADOL) 10 MG TABLET    Take 1 tablet (10 mg total) by mouth every 6 (six) hours as needed.   PROMETHAZINE (PHENERGAN) 12.5 MG TABLET    Take 1 tablet (12.5 mg total) by mouth every 6 (six) hours as needed for nausea or vomiting.     Note:  This document was prepared using Dragon voice recognition software and may include unintentional dictation errors.    Dionne Bucy, MD 05/04/20 [id number]

## 2020-05-04 NOTE — ED Notes (Signed)
Pt taken to CT.

## 2020-05-06 ENCOUNTER — Encounter: Payer: Self-pay | Admitting: Emergency Medicine

## 2020-05-06 ENCOUNTER — Other Ambulatory Visit: Payer: Self-pay

## 2020-05-06 ENCOUNTER — Emergency Department
Admission: EM | Admit: 2020-05-06 | Discharge: 2020-05-06 | Disposition: A | Discharge status: Home / Self Care | Payer: BC Managed Care – PPO | Attending: Emergency Medicine | Admitting: Emergency Medicine

## 2020-05-06 DIAGNOSIS — R569 Unspecified convulsions: Secondary | ICD-10-CM | POA: Diagnosis not present

## 2020-05-06 DIAGNOSIS — R112 Nausea with vomiting, unspecified: Secondary | ICD-10-CM | POA: Diagnosis not present

## 2020-05-06 DIAGNOSIS — Z79899 Other long term (current) drug therapy: Secondary | ICD-10-CM | POA: Diagnosis not present

## 2020-05-06 LAB — COMPREHENSIVE METABOLIC PANEL
ALT: 11 U/L (ref 0–44)
AST: 17 U/L (ref 15–41)
Albumin: 3.9 g/dL (ref 3.5–5.0)
Alkaline Phosphatase: 120 U/L (ref 38–126)
Anion gap: 7 (ref 5–15)
BUN: 15 mg/dL (ref 6–20)
CO2: 24 mmol/L (ref 22–32)
Calcium: 9 mg/dL (ref 8.9–10.3)
Chloride: 106 mmol/L (ref 98–111)
Creatinine, Ser: 1.03 mg/dL — ABNORMAL HIGH (ref 0.44–1.00)
GFR calc Af Amer: >60 mL/min (ref >60)
GFR calc non Af Amer: >60 mL/min (ref >60)
Glucose, Bld: 82 mg/dL (ref 70–99)
Potassium: 4 mmol/L (ref 3.5–5.1)
Sodium: 137 mmol/L (ref 135–145)
Total Bilirubin: 1 mg/dL (ref 0.3–1.2)
Total Protein: 7.1 g/dL (ref 6.5–8.1)

## 2020-05-06 LAB — URINALYSIS, COMPLETE (UACMP) WITH MICROSCOPIC
Bilirubin Urine: NEGATIVE
Glucose, UA: NEGATIVE mg/dL
Hgb urine dipstick: NEGATIVE
Ketones, ur: 5 mg/dL — AB
Nitrite: NEGATIVE
Protein, ur: NEGATIVE mg/dL
Specific Gravity, Urine: 1.019 (ref 1.005–1.030)
pH: 6 (ref 5.0–8.0)

## 2020-05-06 LAB — CBC
HCT: 38.3 % (ref 36.0–46.0)
Hemoglobin: 13.3 g/dL (ref 12.0–15.0)
MCH: 30.2 pg (ref 26.0–34.0)
MCHC: 34.7 g/dL (ref 30.0–36.0)
MCV: 87 fL (ref 80.0–100.0)
Platelets: 296 10*3/uL (ref 150–400)
RBC: 4.4 MIL/uL (ref 3.87–5.11)
RDW: 12.3 % (ref 11.5–15.5)
WBC: 11.7 10*3/uL — ABNORMAL HIGH (ref 4.0–10.5)
nRBC: 0 % (ref 0.0–0.2)

## 2020-05-06 LAB — LIPASE, BLOOD: Lipase: 39 U/L (ref 11–51)

## 2020-05-06 LAB — POCT PREGNANCY, URINE: Preg Test, Ur: NEGATIVE

## 2020-05-06 MED ORDER — HYOSCYAMINE SULFATE 0.125 MG PO TBDP: 0.1250 mg | ORAL_TABLET | ORAL | 0 refills | Status: DC | PRN

## 2020-05-06 MED ORDER — SODIUM CHLORIDE 0.9% FLUSH: 3.0000 mL | Freq: Once | INTRAVENOUS | Status: DC

## 2020-05-06 MED ORDER — HYOSCYAMINE SULFATE 0.125 MG PO TBDP
0.2500 mg | ORAL_TABLET | Freq: Once | ORAL | Status: AC
  Administered 2020-05-06: 0.25 mg via ORAL
  Filled 2020-05-06: qty 2

## 2020-05-06 MED ORDER — METOCLOPRAMIDE HCL 5 MG/ML IJ SOLN
10.0000 mg | Freq: Once | INTRAMUSCULAR | Status: DC
  Filled 2020-05-06: qty 2

## 2020-05-06 MED ORDER — SODIUM CHLORIDE 0.9 % IV BOLUS
1000.0000 mL | Freq: Once | INTRAVENOUS | Status: AC
  Administered 2020-05-06: 1000 mL via INTRAVENOUS

## 2020-05-06 NOTE — ED Provider Notes (Signed)
Medical screening examination/treatment/procedure(s) were conducted as a shared visit with non-physician practitioner(s) and myself.  I personally evaluated the patient during the encounter.    Patient resting comfortably.  Receiving fluids.  Reports that she will have occasional episodes of nausea vomiting.  She does not wish for any antiemetics, rather requests IV fluids and if feeling better she would prefer to go home.  Does seem that this seems to be a somewhat recurrent issue for her from her descriptor, she is awake and alert.  Abdominal exam very reassuring soft nontender nondistended at this time.   Sharyn Creamer, MD 05/06/20 1733

## 2020-05-06 NOTE — ED Notes (Signed)
Attempted IV x2 without success  

## 2020-05-06 NOTE — ED Notes (Signed)
Pt refusing blood work while in triage.

## 2020-05-06 NOTE — ED Notes (Signed)
Pt reports she feels better after getting IVF but has nausea after ice chips.

## 2020-05-06 NOTE — ED Triage Notes (Signed)
First RN Note: pt presents to ED via wheelchair from [facility] with c/o emesis and possible dehydration. Per [facility] RN pt has had recent change in epilepsy medications from Lamictal to Keppra, when bloodwork done to recheck pt with noted elevated WBC, pt then started treatment for "infection of unknown origin" and was given doxycycline and was seen here Wednesday and diagnosed with kidney stone. Pt brought back from [facility] for hyperemesis, states has been unable to hold down liquids, food, or medications.

## 2020-05-12 NOTE — ED Provider Notes (Signed)
[facility] Emergency Department Provider Note ____________________________________________   First MD Initiated Contact with Patient 05/06/20 1628     (approximate)  I have reviewed the triage vital signs and the nursing notes.   HISTORY  Chief Complaint Abdominal Pain and Back Pain  HPI Christina Case is a 24 y.o. female presents to the emergency department for treatment and evaluation of nausea and vomiting. She has had multiple additions and changes to her medication regimen over the past few weeks. She states that she is experiencing nausea and vomiting. Last able to eat or drink yesterday. She vomited just after taking her medications today. She is concerned for dehydration and "just wants fluids."          Past Medical History:  Diagnosis Date  . Concussion 02/09/2020  . Migraine   . ODD (oppositional defiant disorder)   . Seizures [facility])     Patient Active Problem List   Diagnosis Date Noted  . Epigastric pain 05/22/2017  . Other dysphagia 05/22/2017  . 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 PTSD (post-traumatic stress disorder) 07/15/2016  . OCD (obsessive compulsive disorder) 07/15/2016  . Suicidal ideation 07/15/2016  . 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 NO PAST SURGERIES      Prior to Admission medications   Medication Sig Start Date End Date Taking? Authorizing Provider  ketorolac (TORADOL) 10 MG tablet Take 1 tablet (10 mg total) by mouth every 6 (six) hours as needed. 05/04/20  Yes Arta Silence, MD  levETIRAcetam (KEPPRA) 250 MG tablet Take 250 mg by mouth 2 (two) times daily. 04/24/20  Yes [provider]  promethazine (PHENERGAN) 12.5 MG tablet Take 1 tablet (12.5 mg total) by mouth every 6 (six) hours as needed for nausea or vomiting. 05/04/20  Yes Arta Silence, MD  hyoscyamine (ANASPAZ) 0.125 MG TBDP  disintergrating tablet Place 1 tablet (0.125 mg total) under the tongue every 4 (four) hours as needed. 05/06/20   Victorino Dike, FNP    Allergies Hydrocodone, Oxycodone, and Zofran [ondansetron hcl]  Family History  Problem Relation Age of Onset  . Diabetes Mother   . Rheum arthritis Mother   . Osteoarthritis Mother   . Cancer Neg Hx   . Hypertension Neg Hx   . Stroke Neg Hx   . Thyroid disease Neg Hx     Social History Social History   Tobacco Use  . Smoking status: Never Smoker  . Smokeless tobacco: Never Used  Vaping Use  . Vaping Use: Former  Substance Use Topics  . Alcohol use: Yes  . Drug use: No    Review of Systems  Constitutional: No fever/chills Eyes: No visual changes. ENT: No sore throat. Cardiovascular: Denies chest pain. Respiratory: Denies shortness of breath. Gastrointestinal: No abdominal pain.  Positive for nausea and vomiting.  No diarrhea.  No constipation. Genitourinary: Negative for dysuria. Musculoskeletal: Negative for back pain. Skin: Negative for rash. Neurological: Negative for headaches, focal weakness or numbness. ____________________________________________   PHYSICAL EXAM:  VITAL SIGNS: ED Triage Vitals  Enc Vitals Group     BP 05/06/20 1236 118/79     Pulse Rate 05/06/20 1236 90     Resp 05/06/20 1236 16     Temp 05/06/20 1236 98.3 F (36.8 C)     Temp Source 05/06/20 1236 Oral     SpO2 05/06/20 1236 99 %     Weight  05/06/20 1236 160 lb (72.6 kg)     Height 05/06/20 1236 5\' 6"  (1.676 m)     Head Circumference --      Peak Flow --      Pain Score 05/06/20 1238 3     Pain Loc --      Pain Edu? --      Excl. in GC? --     Constitutional: Alert and oriented. Well appearing and in no acute distress. Eyes: Conjunctivae are normal. PERRL. EOMI. Head: Atraumatic. Nose: No congestion/rhinnorhea. Mouth/Throat: Mucous membranes are moist.  Oropharynx non-erythematous. Neck: No stridor.     Hematological/Lymphatic/Immunilogical: No cervical lymphadenopathy. Cardiovascular: Normal rate, regular rhythm. Grossly normal heart sounds.  Good peripheral circulation. Respiratory: Normal respiratory effort.  No retractions. Lungs CTAB. Gastrointestinal: Soft and nontender. No distention. No abdominal bruits. No CVA tenderness. Genitourinary:  Musculoskeletal: No lower extremity tenderness nor edema.  No joint effusions. Neurologic:  Normal speech and language. No gross focal neurologic deficits are appreciated. No gait instability. Skin:  Skin is warm, dry and intact. No rash noted. Psychiatric: Mood and affect are normal. Speech and behavior are normal.  ____________________________________________   LABS (all labs ordered are listed, but only abnormal results are displayed)  Labs Reviewed  COMPREHENSIVE METABOLIC PANEL - Abnormal; Notable for the following components:      Result Value   Creatinine, Ser 1.03 (*)    All other components within normal limits  CBC - Abnormal; Notable for the following components:   WBC 11.7 (*)    All other components within normal limits  URINALYSIS, COMPLETE (UACMP) WITH MICROSCOPIC - Abnormal; Notable for the following components:   Color, Urine YELLOW (*)    APPearance CLOUDY (*)    Ketones, ur 5 (*)    Leukocytes,Ua TRACE (*)    Bacteria, UA RARE (*)    All other components within normal limits  LIPASE, BLOOD  POC URINE PREG, ED  POCT PREGNANCY, URINE   ____________________________________________  EKG  Not indicated. ____________________________________________  RADIOLOGY  ED MD interpretation:    Not indicated.  I, 05/08/20, personally viewed and evaluated these images (plain radiographs) as part of my medical decision making, as well as reviewing the written report by the radiologist.  Official radiology report(s): No results found.  ____________________________________________   PROCEDURES  Procedure(s)  performed (including Critical Care):  Procedures  ____________________________________________   INITIAL IMPRESSION / ASSESSMENT AND PLAN     24 year old female presenting to the emergency department for evaluation after several episodes of vomiting.  Patient initially refused labs in triage but will allow labs to be drawn with IV stick.  Exam is overall reassuring.  No indication for imaging at this time.  Patient states that she "just wants a bag of fluids."  She declines nausea medicine.  DIFFERENTIAL DIAGNOSIS  Adverse reaction to medication, gastroenteritis, dehydration  ED COURSE  IV team had to be consulted to get the IV started.  1 L of fluids infused.  Just prior to discharge, she decided that she wanted nausea medication. She doesn't want phenergan or reglan and is allergic to zofran. Levsin ordered and she will be given a prescription for the same. She is to follow up with her GI doctor if symptoms do not improve with medication. She was satisfied with the plan. ____________________________________________   FINAL CLINICAL IMPRESSION(S) / ED DIAGNOSES  Final diagnoses:  Intractable vomiting with nausea, unspecified vomiting type     ED Discharge Orders  Ordered    hyoscyamine (ANASPAZ) 0.125 MG TBDP disintergrating tablet  Every 4 hours PRN,   Status:  Discontinued     Reprint     05/06/20 1818    hyoscyamine (ANASPAZ) 0.125 MG TBDP disintergrating tablet  Every 4 hours PRN     Discontinue  Reprint     05/06/20 1844           Christina Case was evaluated in Emergency Department on 05/12/2020 for the symptoms described in the history of present illness. She was evaluated in the context of the global COVID-19 pandemic, which necessitated consideration that the patient might be at risk for infection with the SARS-CoV-2 virus that causes COVID-19. Institutional protocols and algorithms that pertain to the evaluation of patients at risk for COVID-19 are in a state  of rapid change based on information released by regulatory bodies including the CDC and federal and state organizations. These policies and algorithms were followed during the patient's care in the ED.   Note:  This document was prepared using Dragon voice recognition software and may include unintentional dictation errors.   Chinita Pester, FNP 05/12/20 1144    Jene Every, MD 05/15/20 1223

## 2020-05-19 ENCOUNTER — Other Ambulatory Visit: Payer: Self-pay

## 2020-05-19 ENCOUNTER — Emergency Department
Admission: EM | Admit: 2020-05-19 | Discharge: 2020-05-19 | Disposition: A | Discharge status: Home / Self Care | Payer: BC Managed Care – PPO | Attending: Emergency Medicine | Admitting: Emergency Medicine

## 2020-05-19 ENCOUNTER — Emergency Department: Payer: BC Managed Care – PPO

## 2020-05-19 ENCOUNTER — Encounter: Payer: Self-pay | Admitting: Emergency Medicine

## 2020-05-19 DIAGNOSIS — R0789 Other chest pain: Secondary | ICD-10-CM | POA: Insufficient documentation

## 2020-05-19 DIAGNOSIS — R002 Palpitations: Secondary | ICD-10-CM | POA: Insufficient documentation

## 2020-05-19 LAB — BASIC METABOLIC PANEL
Anion gap: 12 (ref 5–15)
BUN: 12 mg/dL (ref 6–20)
CO2: 23 mmol/L (ref 22–32)
Calcium: 9.4 mg/dL (ref 8.9–10.3)
Chloride: 103 mmol/L (ref 98–111)
Creatinine, Ser: 0.65 mg/dL (ref 0.44–1.00)
GFR calc Af Amer: >60 mL/min (ref >60)
GFR calc non Af Amer: >60 mL/min (ref >60)
Glucose, Bld: 97 mg/dL (ref 70–99)
Potassium: 3.7 mmol/L (ref 3.5–5.1)
Sodium: 138 mmol/L (ref 135–145)

## 2020-05-19 LAB — CBC
HCT: 39.5 % (ref 36.0–46.0)
Hemoglobin: 13.3 g/dL (ref 12.0–15.0)
MCH: 30 pg (ref 26.0–34.0)
MCHC: 33.7 g/dL (ref 30.0–36.0)
MCV: 89.2 fL (ref 80.0–100.0)
Platelets: 344 10*3/uL (ref 150–400)
RBC: 4.43 MIL/uL (ref 3.87–5.11)
RDW: 12.5 % (ref 11.5–15.5)
WBC: 11.3 10*3/uL — ABNORMAL HIGH (ref 4.0–10.5)
nRBC: 0 % (ref 0.0–0.2)

## 2020-05-19 LAB — TROPONIN I (HIGH SENSITIVITY): Troponin I (High Sensitivity): <2 ng/L (ref <18)

## 2020-05-19 MED ORDER — SODIUM CHLORIDE 0.9% FLUSH: 3.0000 mL | Freq: Once | INTRAVENOUS | Status: DC

## 2020-05-19 NOTE — Discharge Instructions (Addendum)
Return to the ER for new, worsening, or recurrent palpitations, persistent elevated heart rate, chest pain, difficulty breathing, weakness, persistent leg pain or swelling, or any other new or worsening symptoms that concern you.

## 2020-05-19 NOTE — ED Triage Notes (Signed)
States drank some coffee today, despite typically avoids caffine.  Noticed heart rate variations 100-140's by apples watch.  Then relaxed, calmed down and noticed heart rate on apple watch in the 30's at times.  Also c/o intermittent left arm chest pain x 1 week.  Recent history of kidney stone with antibiotics and noticed the left arm/ chest pain with vomiting.  AAOx3.  Skin warm and dry. NAD

## 2020-05-19 NOTE — ED Provider Notes (Signed)
[facility] Emergency Department Provider Note ____________________________________________   First MD Initiated Contact with Patient 05/19/20 2022     (approximate)  I have reviewed the triage vital signs and the nursing notes.   HISTORY  Chief Complaint Palpitations    HPI Christina Case is a 24 y.o. female with PMH as noted below who presents with palpitations, acute onset earlier this morning after she drank a large coffee (which she states she usually does not do) and associated with recent increase stress at work.  The patient states that she noted her heart rate to be in the 130s to 140s on her apple watch, although then it went down significantly after short time.  She also has had some intermittent discomfort in her chest described as tightness although this has been present for about a week.  She states that this started after she took a course of antibiotics and had a kidney stone.  The patient states that the palpitations have resolved and she has been comfortable for the last several hours.  Past Medical History:  Diagnosis Date  . Concussion 02/09/2020  . Migraine   . ODD (oppositional defiant disorder)   . Seizures [facility])     Patient Active Problem List   Diagnosis Date Noted  . Epigastric pain 05/22/2017  . Other dysphagia 05/22/2017  . 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 PTSD (post-traumatic stress disorder) 07/15/2016  . OCD (obsessive compulsive disorder) 07/15/2016  . Suicidal ideation 07/15/2016  . 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 NO PAST SURGERIES      Prior to Admission medications   Medication Sig Start Date End Date Taking? Authorizing Provider  hyoscyamine (ANASPAZ) 0.125 MG TBDP disintergrating tablet Place 1 tablet (0.125 mg total) under the tongue every 4 (four) hours as needed. 05/06/20   Triplett, Rulon Eisenmenger B, FNP    ketorolac (TORADOL) 10 MG tablet Take 1 tablet (10 mg total) by mouth every 6 (six) hours as needed. 05/04/20   Dionne Bucy, MD  levETIRAcetam (KEPPRA) 250 MG tablet Take 250 mg by mouth 2 (two) times daily. 04/24/20   [provider]  promethazine (PHENERGAN) 12.5 MG tablet Take 1 tablet (12.5 mg total) by mouth every 6 (six) hours as needed for nausea or vomiting. 05/04/20   Dionne Bucy, MD    Allergies Hydrocodone, Oxycodone, and Zofran [facility]]  Family History  Problem Relation Age of Onset  . Diabetes Mother   . Rheum arthritis Mother   . Osteoarthritis Mother   . Cancer Neg Hx   . Hypertension Neg Hx   . Stroke Neg Hx   . Thyroid disease Neg Hx     Social History Social History   Tobacco Use  . Smoking status: Never Smoker  . Smokeless tobacco: Never Used  Vaping Use  . Vaping Use: Former  Substance Use Topics  . Alcohol use: Yes  . Drug use: No    Review of Systems  Constitutional: No fever/chills. Eyes: No visual changes. ENT: No sore throat. Cardiovascular: Positive for resolved chest discomfort. Respiratory: Denies shortness of breath. Gastrointestinal: No vomiting or diarrhea.  Genitourinary: Negative for flank pain. Musculoskeletal: Negative for back pain. Skin: Negative for rash. Neurological: Negative for headaches, focal weakness or numbness.   ____________________________________________   PHYSICAL EXAM:  VITAL SIGNS: ED Triage Vitals  Enc Vitals Group     BP 05/19/20 1506 126/76  Pulse Rate 05/19/20 1506 98     Resp 05/19/20 1506 16     Temp 05/19/20 1506 98.2 F (36.8 C)     Temp Source 05/19/20 1506 Oral     SpO2 05/19/20 1506 100 %     Weight 05/19/20 1510 160 lb 0.9 oz (72.6 kg)     Height 05/19/20 1510 5\' 6"  (1.676 m)     Head Circumference --      Peak Flow --      Pain Score 05/19/20 1509 0     Pain Loc --      Pain Edu? --      Excl. in GC? --     Constitutional: Alert and oriented. Well  appearing and in no acute distress. Eyes: Conjunctivae are normal.  Head: Atraumatic. Nose: No congestion/rhinnorhea. Mouth/Throat: Mucous membranes are moist.   Neck: Normal range of motion.  Cardiovascular: Normal rate, regular rhythm. Grossly normal heart sounds.  Good peripheral circulation. Respiratory: Normal respiratory effort.  No retractions. Lungs CTAB. Gastrointestinal: No distention.  Musculoskeletal: No lower extremity edema.  Extremities warm and well perfused.  Neurologic:  Normal speech and language. No gross focal neurologic deficits are appreciated.  Skin:  Skin is warm and dry. No rash noted. Psychiatric: Mood and affect are normal. Speech and behavior are normal.  ____________________________________________   LABS (all labs ordered are listed, but only abnormal results are displayed)  Labs Reviewed  CBC - Abnormal; Notable for the following components:      Result Value   WBC 11.3 (*)    All other components within normal limits  BASIC METABOLIC PANEL  POC URINE PREG, ED  TROPONIN I (HIGH SENSITIVITY)  TROPONIN I (HIGH SENSITIVITY)   ____________________________________________  EKG  ED ECG REPORT I, 07/20/20, the attending physician, personally viewed and interpreted this ECG.  Date: 05/20/2020 EKG Time: 1423 Rate: 102 Rhythm: Sinus tachycardia QRS Axis: normal Intervals: normal ST/T Wave abnormalities: normal Narrative Interpretation: no evidence of acute ischemia  ____________________________________________  RADIOLOGY  CXR: No focal infiltrate or edema  ____________________________________________   PROCEDURES  Procedure(s) performed: No  Procedures  Critical Care performed: No ____________________________________________   INITIAL IMPRESSION / ASSESSMENT AND PLAN / ED COURSE  Pertinent labs & imaging results that were available during my care of the patient were reviewed by me and considered in my medical decision  making (see chart for details).  24 year old female with PMH as noted above presents after she had palpitations earlier today after drinking coffee.  She states that she was also feeling anxious. She noted that her heart rate was in the 130s to 140s although then she calmed down and it went down significantly.  She got readings as low as the 30s on her apple watch but believes that these were inaccurate.  She states that she has had some intermittent chest discomfort over the last week after a course of antibiotics and having a kidney stone a few weeks ago.  On exam, the patient is overall well-appearing.  Her vital signs are normal.  The physical exam is unremarkable.  EKG is nonischemic.  The patient waited for approximately 6 hours prior to being seen.  During this time she had labs including a troponin all of which were within normal limits.  Her chest x-ray is also normal.  The patient reports that her symptoms have resolved although she just feels mild soreness in both legs and slightly tired.  Overall presentation is consistent with tachycardia related to  caffeine and/or anxiety.  Given that the symptoms had already been present for several hours before the troponin was obtained, as well as the patient's overall low risk of ACS or other acute cardiac disease, there is no indication for repeat troponin.  Given the resolved symptoms and lack of hypoxia or sustained tachycardia there is no evidence of PE.  Given the resolved symptoms and the negative work-up, there is no indication for further ED observation or work-up.  The patient feels comfortable going home.  I counseled her on the results of the work-up.  Return precautions given, and she expresses understanding.    ____________________________________________   FINAL CLINICAL IMPRESSION(S) / ED DIAGNOSES  Final diagnoses:  Palpitations      NEW MEDICATIONS STARTED DURING THIS VISIT:  Discharge Medication List as of 05/19/2020   8:59 PM       Note:  This document was prepared using Dragon voice recognition software and may include unintentional dictation errors.   Dionne Bucy, MD 05/20/20 [phone]

## 2020-07-13 ENCOUNTER — Ambulatory Visit
Admission: EM | Admit: 2020-07-13 | Discharge: 2020-07-13 | Disposition: A | Discharge status: Home / Self Care | Payer: BC Managed Care – PPO | Attending: Emergency Medicine | Admitting: Emergency Medicine

## 2020-07-13 ENCOUNTER — Other Ambulatory Visit: Payer: Self-pay

## 2020-07-13 DIAGNOSIS — N611 Abscess of the breast and nipple: Secondary | ICD-10-CM | POA: Diagnosis not present

## 2020-07-13 DIAGNOSIS — N61 Mastitis without abscess: Secondary | ICD-10-CM | POA: Diagnosis not present

## 2020-07-13 MED ORDER — SULFAMETHOXAZOLE-TRIMETHOPRIM 800-160 MG PO TABS
1.0000 | ORAL_TABLET | Freq: Two times a day (BID) | ORAL | 0 refills | Status: DC
Start: 2020-07-13 — End: 2020-07-21

## 2020-07-13 NOTE — Discharge Instructions (Addendum)
Take Septra DS as directed.    Keep your wound clean and dry.  Wash it gently twice a day with soap and water.      Return here if you see signs of infection, such as increased pain, redness, pus-like drainage, warmth, fever, chills, or other concerning symptoms.    Schedule a follow-up appointment with your primary care provider in 1 week.

## 2020-07-13 NOTE — ED Triage Notes (Signed)
Patient report right breast pain, swelling, and erythema x3 days. Reports 4/10 pain at rest but 8/10 pain once pressure is applied to the area.

## 2020-07-13 NOTE — ED Provider Notes (Signed)
Christina Case    CSN: [id number] Arrival date & time: 07/13/20  1557      History   Chief Complaint Chief Complaint  Patient presents with  . Breast Pain    HPI Christina Case is a 24 y.o. female.   Patient presents with redness, pain, swelling of her right breast x 3 days.  She denies fever, chills, nipple discharge, chest pain, shortness of breath, abdominal pain, or other symptoms.  No treatments attempted at home.  The history is provided by the patient.    Past Medical History:  Diagnosis Date  . Concussion 02/09/2020  . Migraine   . ODD (oppositional defiant disorder)   . Seizures [facility])     Patient Active Problem List   Diagnosis Date Noted  . Epigastric pain 05/22/2017  . Other dysphagia 05/22/2017  . 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 PTSD (post-traumatic stress disorder) 07/15/2016  . OCD (obsessive compulsive disorder) 07/15/2016  . Suicidal ideation 07/15/2016  . 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 NO PAST SURGERIES      OB History    Gravida  0   Para  0   Term  0   Preterm  0   AB  0   Living  0     SAB  0   TAB  0   Ectopic  0   Multiple  0   Live Births  0            Home Medications    Prior to Admission medications   Medication Sig Start Date End Date Taking? Authorizing Provider  hyoscyamine (ANASPAZ) 0.125 MG TBDP disintergrating tablet Place 1 tablet (0.125 mg total) under the tongue every 4 (four) hours as needed. 05/06/20   Triplett, Rulon Eisenmenger B, FNP  ketorolac (TORADOL) 10 MG tablet Take 1 tablet (10 mg total) by mouth every 6 (six) hours as needed. 05/04/20   Dionne Bucy, MD  levETIRAcetam (KEPPRA) 250 MG tablet Take 250 mg by mouth 2 (two) times daily. 04/24/20   [provider]  promethazine (PHENERGAN) 12.5 MG tablet Take 1 tablet (12.5 mg total) by mouth every 6 (six) hours as needed  for nausea or vomiting. 05/04/20   Dionne Bucy, MD  sulfamethoxazole-trimethoprim (BACTRIM DS) 800-160 MG tablet Take 1 tablet by mouth 2 (two) times daily for 7 days. 07/13/20 07/20/20  Mickie Bail, NP    Family History Family History  Problem Relation Age of Onset  . Diabetes Mother   . Rheum arthritis Mother   . Osteoarthritis Mother   . Cancer Neg Hx   . Hypertension Neg Hx   . Stroke Neg Hx   . Thyroid disease Neg Hx     Social History Social History   Tobacco Use  . Smoking status: Never Smoker  . Smokeless tobacco: Never Used  Vaping Use  . Vaping Use: Former  Substance Use Topics  . Alcohol use: Yes  . Drug use: No     Allergies   Doxycycline, Hydrocodone, Oxycodone, and Zofran [ondansetron hcl]   Review of Systems Review of Systems  Constitutional: Negative for chills and fever.  HENT: Negative for ear pain and sore throat.   Eyes: Negative for pain and visual disturbance.  Respiratory: Negative for cough and shortness of breath.   Cardiovascular: Negative for chest pain and palpitations.  Gastrointestinal: Negative for abdominal pain and vomiting.  Genitourinary: Negative for dysuria and hematuria.  Musculoskeletal: Negative for arthralgias and back pain.  Skin: Positive for color change. Negative for rash.  Neurological: Negative for seizures, syncope, weakness and numbness.  All other systems reviewed and are negative.    Physical Exam Triage Vital Signs ED Triage Vitals  Enc Vitals Group     BP 07/13/20 1605 123/80     Pulse Rate 07/13/20 1605 93     Resp 07/13/20 1605 16     Temp 07/13/20 1605 98.3 F (36.8 C)     Temp src --      SpO2 07/13/20 1605 98 %     Weight --      Height --      Head Circumference --      Peak Flow --      Pain Score 07/13/20 1602 5     Pain Loc --      Pain Edu? --      Excl. in GC? --    No data found.  Updated Vital Signs BP 123/80   Pulse 93   Temp 98.3 F (36.8 C)   Resp 16   SpO2 98%    Visual Acuity Right Eye Distance:   Left Eye Distance:   Bilateral Distance:    Right Eye Near:   Left Eye Near:    Bilateral Near:     Physical Exam Vitals and nursing note reviewed.  Constitutional:      General: She is not in acute distress.    Appearance: She is well-developed. She is not ill-appearing.  HENT:     Head: Normocephalic and atraumatic.     Mouth/Throat:     Mouth: Mucous membranes are moist.  Eyes:     Conjunctiva/sclera: Conjunctivae normal.  Cardiovascular:     Rate and Rhythm: Normal rate and regular rhythm.     Heart sounds: No murmur heard.   Pulmonary:     Effort: Pulmonary effort is normal. No respiratory distress.     Breath sounds: Normal breath sounds.  Abdominal:     Palpations: Abdomen is soft.     Tenderness: There is no abdominal tenderness.  Musculoskeletal:     Cervical back: Neck supple.  Skin:    General: Skin is warm and dry.     Findings: Erythema present. No lesion.     Comments: Light pink erythema just above right areola; 3 cm area of induration; no lesions; no nipple discharge.  Neurological:     General: No focal deficit present.     Mental Status: She is alert and oriented to person, place, and time.     Gait: Gait normal.  Psychiatric:        Mood and Affect: Mood normal.        Behavior: Behavior normal.      UC Treatments / Results  Labs (all labs ordered are listed, but only abnormal results are displayed) Labs Reviewed - No data to display  EKG   Radiology No results found.  Procedures Procedures (including critical care time)  Medications Ordered in UC Medications - No data to display  Initial Impression / Assessment and Plan / UC Course  I have reviewed the triage vital signs and the nursing notes.  Pertinent labs & imaging results that were available during my care of the patient were reviewed by me and considered in my medical decision making (see chart for details).   Cellulitis and abscess  of right abscess.  Treating with  Septra DS (cannot take doxycycline due to n/v).  Wound care instructions and signs of worsening infection discussed with patient at length.  Instructed her to seek treatment immediately if she notes signs of worsening infection.  Instructed her to follow-up with her PCP in 1 week for a recheck.  Patient agrees to plan of care.   Final Clinical Impressions(s) / UC Diagnoses   Final diagnoses:  Abscess of breast, right  Cellulitis of right breast     Discharge Instructions     Take Septra DS as directed.    Keep your wound clean and dry.  Wash it gently twice a day with soap and water.      Return here if you see signs of infection, such as increased pain, redness, pus-like drainage, warmth, fever, chills, or other concerning symptoms.    Schedule a follow-up appointment with your primary care provider in 1 week.         ED Prescriptions    Medication Sig Dispense Auth. Provider   sulfamethoxazole-trimethoprim (BACTRIM DS) 800-160 MG tablet Take 1 tablet by mouth 2 (two) times daily for 7 days. 14 tablet Mickie Bail, NP     PDMP not reviewed this encounter.   Mickie Bail, NP 07/13/20 [phone]

## 2020-07-20 ENCOUNTER — Emergency Department
Admission: EM | Admit: 2020-07-20 | Discharge: 2020-07-20 | Discharge status: Against Medical Advice | Payer: BC Managed Care – PPO

## 2020-07-20 DIAGNOSIS — Z20822 Contact with and (suspected) exposure to covid-19: Secondary | ICD-10-CM | POA: Diagnosis not present

## 2020-07-20 DIAGNOSIS — N611 Abscess of the breast and nipple: Secondary | ICD-10-CM | POA: Diagnosis present

## 2020-07-20 DIAGNOSIS — Z79899 Other long term (current) drug therapy: Secondary | ICD-10-CM | POA: Diagnosis not present

## 2020-07-21 ENCOUNTER — Emergency Department
Admission: EM | Admit: 2020-07-21 | Discharge: 2020-07-21 | Disposition: A | Discharge status: Home / Self Care | Payer: BC Managed Care – PPO | Attending: Emergency Medicine | Admitting: Emergency Medicine

## 2020-07-21 ENCOUNTER — Other Ambulatory Visit: Payer: Self-pay

## 2020-07-21 ENCOUNTER — Emergency Department: Payer: BC Managed Care – PPO

## 2020-07-21 DIAGNOSIS — R609 Edema, unspecified: Secondary | ICD-10-CM

## 2020-07-21 DIAGNOSIS — L0291 Cutaneous abscess, unspecified: Secondary | ICD-10-CM

## 2020-07-21 LAB — URINALYSIS, COMPLETE (UACMP) WITH MICROSCOPIC
Bilirubin Urine: NEGATIVE
Glucose, UA: NEGATIVE mg/dL
Hgb urine dipstick: NEGATIVE
Ketones, ur: 5 mg/dL — AB
Leukocytes,Ua: NEGATIVE
Nitrite: NEGATIVE
Protein, ur: NEGATIVE mg/dL
Specific Gravity, Urine: 1.015 (ref 1.005–1.030)
pH: 6 (ref 5.0–8.0)

## 2020-07-21 LAB — CBC WITH DIFFERENTIAL/PLATELET
Abs Immature Granulocytes: 0.07 10*3/uL (ref 0.00–0.07)
Basophils Absolute: 0 10*3/uL (ref 0.0–0.1)
Basophils Relative: 0 %
Eosinophils Absolute: 0 10*3/uL (ref 0.0–0.5)
Eosinophils Relative: 0 %
HCT: 40.5 % (ref 36.0–46.0)
Hemoglobin: 13.7 g/dL (ref 12.0–15.0)
Immature Granulocytes: 1 %
Lymphocytes Relative: 17 %
Lymphs Abs: 2.2 10*3/uL (ref 0.7–4.0)
MCH: 30.4 pg (ref 26.0–34.0)
MCHC: 33.8 g/dL (ref 30.0–36.0)
MCV: 89.8 fL (ref 80.0–100.0)
Monocytes Absolute: 0.9 10*3/uL (ref 0.1–1.0)
Monocytes Relative: 7 %
Neutro Abs: 9.4 10*3/uL — ABNORMAL HIGH (ref 1.7–7.7)
Neutrophils Relative %: 75 %
Platelets: 329 10*3/uL (ref 150–400)
RBC: 4.51 MIL/uL (ref 3.87–5.11)
RDW: 12.3 % (ref 11.5–15.5)
WBC: 12.6 10*3/uL — ABNORMAL HIGH (ref 4.0–10.5)
nRBC: 0 % (ref 0.0–0.2)

## 2020-07-21 LAB — COMPREHENSIVE METABOLIC PANEL
ALT: 13 U/L (ref 0–44)
AST: 13 U/L — ABNORMAL LOW (ref 15–41)
Albumin: 4.2 g/dL (ref 3.5–5.0)
Alkaline Phosphatase: 107 U/L (ref 38–126)
Anion gap: 8 (ref 5–15)
BUN: 10 mg/dL (ref 6–20)
CO2: 29 mmol/L (ref 22–32)
Calcium: 9.2 mg/dL (ref 8.9–10.3)
Chloride: 102 mmol/L (ref 98–111)
Creatinine, Ser: 0.59 mg/dL (ref 0.44–1.00)
GFR calc Af Amer: >60 mL/min (ref >60)
GFR calc non Af Amer: >60 mL/min (ref >60)
Glucose, Bld: 100 mg/dL — ABNORMAL HIGH (ref 70–99)
Potassium: 3.5 mmol/L (ref 3.5–5.1)
Sodium: 139 mmol/L (ref 135–145)
Total Bilirubin: 0.8 mg/dL (ref 0.3–1.2)
Total Protein: 7.5 g/dL (ref 6.5–8.1)

## 2020-07-21 LAB — SARS CORONAVIRUS 2 BY RT PCR (HOSPITAL ORDER, PERFORMED IN ~~LOC~~ HOSPITAL LAB): SARS Coronavirus 2: NEGATIVE

## 2020-07-21 LAB — LACTIC ACID, PLASMA: Lactic Acid, Venous: 0.6 mmol/L (ref 0.5–1.9)

## 2020-07-21 LAB — POCT PREGNANCY, URINE: Preg Test, Ur: NEGATIVE

## 2020-07-21 MED ORDER — TRAMADOL HCL 50 MG PO TABS: 50.0000 mg | ORAL_TABLET | Freq: Four times a day (QID) | ORAL | 0 refills | Status: DC | PRN

## 2020-07-21 NOTE — ED Notes (Signed)
See triage note  Presents with large red swollen area to right breast  Was placed on septra  Then changed to Keflex  States she started it 2 days ago    Area is very tender to touch

## 2020-07-21 NOTE — ED Notes (Signed)
Pt returns to lobby, inquiring over wait time; pt becomes upset and begins yelling at nurse because I cannot give her an exact time she will be seen; pt assured that she will go back as soon as a exam room is available for her

## 2020-07-21 NOTE — ED Notes (Signed)
Pt up to counter to find out wait time. Pt name not on the board. Pt advised that she was called several times with no response. Pt added back on the board. Pt advised to not leave again.

## 2020-07-21 NOTE — ED Notes (Signed)
Called no answer

## 2020-07-21 NOTE — ED Provider Notes (Addendum)
[facility] Emergency Department Provider Note  Time seen: 2:22 PM  I have reviewed the triage vital signs and the nursing notes.   HISTORY  Chief Complaint Abscess   HPI Christina Case is a 24 y.o. female with a past medical history of migraines, seizure disorder, presents to the emergency department for right breast pain and swelling.  According to the patient she was seen by an urgent care as well as her PCP for right breast pain and swelling which has gotten significantly worse and larger over the past 1 week.  Started Keflex 2 days ago and the patient has noticed that the area has gotten even larger so she came to the emergency department today for evaluation.  Patient denies any fever.  No history of breast abscess previously.  Patient has no children and has never breast-fed.  Patient describes the pain as 6/10 aching pain in her right breast worse with any movement or touching the area.  Denies any discharge.   Past Medical History:  Diagnosis Date  . Concussion 02/09/2020  . Migraine   . ODD (oppositional defiant disorder)   . Seizures [facility])     Patient Active Problem List   Diagnosis Date Noted  . Epigastric pain 05/22/2017  . Other dysphagia 05/22/2017  . 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 PTSD (post-traumatic stress disorder) 07/15/2016  . OCD (obsessive compulsive disorder) 07/15/2016  . Suicidal ideation 07/15/2016  . 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 NO PAST SURGERIES      Prior to Admission medications   Medication Sig Start Date End Date Taking? Authorizing Provider  hyoscyamine (ANASPAZ) 0.125 MG TBDP disintergrating tablet Place 1 tablet (0.125 mg total) under the tongue every 4 (four) hours as needed. 05/06/20   Triplett, Rulon Eisenmenger B, FNP  ketorolac (TORADOL) 10 MG tablet Take 1 tablet (10 mg total) by mouth every 6 (six) hours as  needed. 05/04/20   Dionne Bucy, MD  levETIRAcetam (KEPPRA) 250 MG tablet Take 250 mg by mouth 2 (two) times daily. 04/24/20   [provider]  promethazine (PHENERGAN) 12.5 MG tablet Take 1 tablet (12.5 mg total) by mouth every 6 (six) hours as needed for nausea or vomiting. 05/04/20   Dionne Bucy, MD    Allergies  Allergen Reactions  . Doxycycline Nausea And Vomiting  . Sulfa Antibiotics Hives  . Hydrocodone Other (See Comments)    Dizziness  . Oxycodone Other (See Comments)    Dizziness  . Zofran [Ondansetron Hcl] Nausea And Vomiting    Family History  Problem Relation Age of Onset  . Diabetes Mother   . Rheum arthritis Mother   . Osteoarthritis Mother   . Cancer Neg Hx   . Hypertension Neg Hx   . Stroke Neg Hx   . Thyroid disease Neg Hx     Social History Social History   Tobacco Use  . Smoking status: Never Smoker  . Smokeless tobacco: Never Used  Vaping Use  . Vaping Use: Former  Substance Use Topics  . Alcohol use: Not Currently  . Drug use: No    Review of Systems Constitutional: Negative for fever. Cardiovascular: Negative for chest pain. Respiratory: Negative for shortness of breath. Gastrointestinal: Negative for abdominal pain, vomiting Musculoskeletal: Negative for musculoskeletal complaints Skin: Redness of the right breast Neurological: Negative for headache All other ROS negative  ____________________________________________   PHYSICAL EXAM:  VITAL  SIGNS: ED Triage Vitals  Enc Vitals Group     BP 07/20/20 2358 126/82     Pulse Rate 07/20/20 2358 (!) 112     Resp 07/20/20 2358 17     Temp 07/20/20 2358 98.5 F (36.9 C)     Temp Source 07/20/20 2358 Oral     SpO2 07/20/20 2358 100 %     Weight 07/20/20 2358 168 lb (76.2 kg)     Height 07/20/20 2358 5\' 6"  (1.676 m)     Head Circumference --      Peak Flow --      Pain Score 07/21/20 0005 8     Pain Loc --      Pain Edu? --      Excl. in GC? --      Constitutional: Alert and oriented. Well appearing and in no distress. Eyes: Normal exam ENT      Head: Normocephalic and atraumatic.      Mouth/Throat: Mucous membranes are moist. Cardiovascular: Normal rate, regular rhythm.  Respiratory: Normal respiratory effort without tachypnea nor retractions. Breath sounds are clear Gastrointestinal: Soft and nontender. No distention. Musculoskeletal: Nontender with normal range of motion in all extremities.  Neurologic:  Normal speech and language. No gross focal neurologic deficits Skin: Patient has moderate erythema over the right breast with significant tenderness at this area extending approximately 10 cm in diameter most tender and erythematous around the areola. Psychiatric: Mood and affect are normal.   ____________________________________________   RADIOLOGY  retroareolar region measuring 4.0 x 4.8 x 1.7 cm with increased  vascular flow in overlying skin thickening.  Consistent with breast abscess.  ____________________________________________   INITIAL IMPRESSION / ASSESSMENT AND PLAN / ED COURSE  Pertinent labs & imaging results that were available during my care of the patient were reviewed by me and considered in my medical decision making (see chart for details).   Patient presents emergency department for right breast pain redness and swelling, symptoms have been ongoing x2 weeks although much worse over the past 1 week and has enlarged in size over the past 2 to 3 days per patient.  Patient has approximately 10 cm area of erythema tenderness and redness to the right breast including the areola.  Ultrasound consistent with 4 x 4 x 1.7 area of abscess versus phlegmon.  Patient has been on Keflex x2 days.  We will discuss with surgery for further recommendations upon disposition.  I spoke to surgery, who referred me to interventional radiology.  I spoke to interventional radiology who then referred me to their dedicated  mammographer.  I spoke with them and they will arrange for drainage today.  Patient agreeable.  I spoke to Dr. 09/20/20, he has attempted aspiration, states the patient cannot tolerate much of the aspiration but he was able to aspirate the abscess somewhat.  He states Keflex should have good coverage and he wishes to see the patient back in the breast center in 7 days for repeat ultrasound at that time.  I spoke to the patient regarding this plan, patient began crying extremely loudly.  States she does not want surgery.  I again discussed with the patient that this is only a possibility with antibiotics are not effective in clearing the infection in 1 week.  Patient continued to cry extremely loudly over me talking.  I waited in the room for several minutes for the patient to stop but she continued to cry extremely loudly.  I spoke to the patient's family  member who states they understand and they will follow-up in 7 days.  I also discussed return precautions for development of fever or worsening swelling and pain.  Christina Case was evaluated in Emergency Department on 07/21/2020 for the symptoms described in the history of present illness. She was evaluated in the context of the global COVID-19 pandemic, which necessitated consideration that the patient might be at risk for infection with the SARS-CoV-2 virus that causes COVID-19. Institutional protocols and algorithms that pertain to the evaluation of patients at risk for COVID-19 are in a state of rapid change based on information released by regulatory bodies including the CDC and federal and state organizations. These policies and algorithms were followed during the patient's care in the ED.  ____________________________________________   FINAL CLINICAL IMPRESSION(S) / ED DIAGNOSES  Right breast abscess   Minna Antis, MD 07/21/20 1640    Minna Antis, MD 07/21/20 1657

## 2020-07-21 NOTE — ED Notes (Addendum)
Pt to lobby to inform nurse that she needs her keflex; explained to pt that if she has her rx meds that she can take her meds as rx; pt st she does not have her med with her; explained to pt that when she sees the ED provider that can be addressed; pt begins yelling at nurse and st "what am I supposed to do?!"; explained to pt that it would not be detrimental at this time to be somewhat off schedule with her keflex; pt cont to yell at nurse and leaves lobby

## 2020-07-21 NOTE — ED Notes (Signed)
Patient transported to Ultrasound 

## 2020-07-21 NOTE — Discharge Instructions (Addendum)
You have been seen in the emergency department today for a breast abscess.  Please continue to take your antibiotics as prescribed by your doctor.  You may also take pain medication as written, as needed.  Please call the number provided below for the [facility] breast center to arrange a follow-up appointment in 1 week for repeat ultrasound.  Please return to the emergency department for any worsening pain significant increase in size or development of fever, or any other symptom personally concerning to yourself.  [place] [facility]: [phone]

## 2020-07-21 NOTE — ED Notes (Signed)
Called   No answer in lobby  

## 2020-07-21 NOTE — ED Triage Notes (Signed)
PT toED via POV c/o abcess to right breast x2 weeks. PT has been taking keflex since last night and abcess has gotten larger. No fevers.

## 2020-07-25 ENCOUNTER — Other Ambulatory Visit: Payer: Self-pay | Admitting: Emergency Medicine

## 2020-07-25 ENCOUNTER — Other Ambulatory Visit: Payer: Self-pay | Admitting: Student

## 2020-07-25 DIAGNOSIS — N611 Abscess of the breast and nipple: Secondary | ICD-10-CM

## 2020-07-26 LAB — AEROBIC/ANAEROBIC CULTURE W GRAM STAIN (SURGICAL/DEEP WOUND)

## 2020-07-26 LAB — ACID FAST SMEAR (AFB, MYCOBACTERIA): Acid Fast Smear: NEGATIVE
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
## 2020-08-03 ENCOUNTER — Ambulatory Visit: Payer: BC Managed Care – PPO | Attending: Student

## 2020-08-08 ENCOUNTER — Ambulatory Visit: Payer: BC Managed Care – PPO | Admitting: Obstetrics and Gynecology

## 2020-08-14 ENCOUNTER — Other Ambulatory Visit: Payer: BC Managed Care – PPO

## 2020-08-25 LAB — FUNGUS CULTURE WITH STAIN

## 2020-08-25 LAB — FUNGAL ORGANISM REFLEX

## 2020-08-25 LAB — FUNGUS CULTURE RESULT

## 2020-09-19 LAB — ACID FAST CULTURE WITH REFLEXED SENSITIVITIES (MYCOBACTERIA): Acid Fast Culture: NEGATIVE

## 2023-11-21 ENCOUNTER — Ambulatory Visit (HOSPITAL_BASED_OUTPATIENT_CLINIC_OR_DEPARTMENT_OTHER)
Admission: EM | Admit: 2023-11-21 | Discharge: 2023-11-21 | Disposition: A | Discharge status: Home / Self Care | Payer: Managed Care, Other (non HMO) | Attending: Internal Medicine | Admitting: Internal Medicine

## 2023-11-21 ENCOUNTER — Encounter (HOSPITAL_BASED_OUTPATIENT_CLINIC_OR_DEPARTMENT_OTHER): Payer: Self-pay

## 2023-11-21 DIAGNOSIS — J029 Acute pharyngitis, unspecified: Secondary | ICD-10-CM | POA: Diagnosis not present

## 2023-11-21 DIAGNOSIS — J0100 Acute maxillary sinusitis, unspecified: Secondary | ICD-10-CM | POA: Insufficient documentation

## 2023-11-21 DIAGNOSIS — R0981 Nasal congestion: Secondary | ICD-10-CM | POA: Diagnosis present

## 2023-11-21 LAB — POCT RAPID STREP A (OFFICE): Rapid Strep A Screen: NEGATIVE

## 2023-11-21 MED ORDER — AMOXICILLIN-POT CLAVULANATE 400-57 MG/5ML PO SUSR: 875.0000 mg | Freq: Two times a day (BID) | ORAL | 0 refills | Status: AC

## 2023-11-21 MED ORDER — AMOXICILLIN-POT CLAVULANATE 875-125 MG PO TABS: 1.0000 | ORAL_TABLET | Freq: Two times a day (BID) | ORAL | 0 refills | Status: DC

## 2023-11-21 NOTE — Discharge Instructions (Addendum)
 Your evaluation shows you have a bacterial sinus infection. - Take antibiotic sent to pharmacy as directed to treat sinus infection. - Purchase Mucinex over the counter and take this every 12 hours as needed for nasal congestion. - Warm compresses to the cheeks and forehead as needed to help with sinus headaches as well as tylenol  as needed.  If you develop any new or worsening symptoms or if your symptoms do not start to improve, please return here or follow-up with your primary care provider. If your symptoms are severe, please go to the emergency room.
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
## 2023-11-21 NOTE — ED Triage Notes (Signed)
 Sore throat x 1 week. Left ear pressure. Sinus congestion.

## 2023-11-24 LAB — CULTURE, GROUP A STREP (THRC)

## 2024-03-18 LAB — CYTOLOGY - PAP

## 2024-09-14 ENCOUNTER — Ambulatory Visit: Admitting: Obstetrics and Gynecology

## 2024-09-14 ENCOUNTER — Encounter: Payer: Self-pay | Admitting: Obstetrics and Gynecology

## 2024-09-14 VITALS — BP 121/82 | HR 89 | Ht 65.0 in | Wt 216.0 lb

## 2024-09-14 DIAGNOSIS — Z30431 Encounter for routine checking of intrauterine contraceptive device: Secondary | ICD-10-CM | POA: Diagnosis not present

## 2024-09-14 DIAGNOSIS — Z3009 Encounter for other general counseling and advice on contraception: Secondary | ICD-10-CM | POA: Diagnosis not present

## 2024-09-14 NOTE — Progress Notes (Signed)
 Pt presents for a consult for painful periods before IUD. Pt wants to get the IUD taken out but wants to control the heavy painful periods after. Pt had last pap on 05/01 of this year. No other questions or concerns at this time.

## 2024-09-14 NOTE — Progress Notes (Signed)
   NEW GYNECOLOGY VISIT  Subjective:  Christina Case is a 28 y.o. G0P0000 with Mirena  IUD in place here for discussion of IUD management  History of painful periods that improved with IUD and nexplanon. Does not like IUD and feels anxious about it being in place. Wants to see what her cycles look like off of hormonal birth control and would consider getting a Nexplanon again if they were bothersome.   Considering pregnancy in the near future but her partner is not ready yet.   Hx notable for migraine w/ aura & seizure disorder. No current medications. She is worried her issues could be exacerbated by IUD removal so wants to make sure her medication regimen is organized and set before removing.   Was told she possibly has PCOS. Had irregular periods close to menarche but they became q28d as she got older. Had serum labs through [facility] that were not c/w PCOS. She had multiple follicles on one ovary that she was told met criteria for PCO morphology. She does not get acne or have issues with hirsutism  I personally reviewed the following: - Pap NILM 03/18/2024 - Total T 44 03/18/24 - TSH 1.55 03/18/24 - Pelvic US  normal 07/26/24   Objective:   Vitals:   09/14/24 0954  BP: 121/82  Pulse: 89  Weight: 216 lb (98 kg)  Height: 5' 5 (1.651 m)   General:  Alert, oriented and cooperative. Patient is in no acute distress.  Skin: Skin is warm and dry. No rash noted.   Cardiovascular: Normal heart rate noted  Respiratory: Normal respiratory effort, no problems with respiration noted   Assessment and Plan:  Christina Case is a 28 y.o. presenting for contraception counseling  Continue Mirena  IUD for now Once seizure/migraine meds are stable, she will call for IUD removal Will give it 3-6 months off IUD and she will use condoms for contraception May ultimately opt for Nexplanon depending on how her periods go off of IUD Agree PCOS is unlikely based on current eval  Christina JAYSON Carolin, MD

## 2024-11-08 ENCOUNTER — Emergency Department (HOSPITAL_COMMUNITY)
Admission: EM | Admit: 2024-11-08 | Discharge: 2024-11-08 | Disposition: A | Discharge status: Home / Self Care | Attending: Emergency Medicine | Admitting: Emergency Medicine

## 2024-11-08 ENCOUNTER — Emergency Department (HOSPITAL_COMMUNITY)

## 2024-11-08 ENCOUNTER — Other Ambulatory Visit: Payer: Self-pay

## 2024-11-08 DIAGNOSIS — R109 Unspecified abdominal pain: Secondary | ICD-10-CM | POA: Diagnosis present

## 2024-11-08 DIAGNOSIS — N132 Hydronephrosis with renal and ureteral calculous obstruction: Secondary | ICD-10-CM | POA: Insufficient documentation

## 2024-11-08 DIAGNOSIS — N200 Calculus of kidney: Secondary | ICD-10-CM

## 2024-11-08 LAB — URINALYSIS, ROUTINE W REFLEX MICROSCOPIC
Bilirubin Urine: NEGATIVE
Glucose, UA: NEGATIVE mg/dL
Ketones, ur: NEGATIVE mg/dL
Leukocytes,Ua: NEGATIVE
Nitrite: NEGATIVE
Protein, ur: 30 mg/dL — AB
RBC / HPF: >50 RBC/hpf (ref 0–5)
Specific Gravity, Urine: 1.023 (ref 1.005–1.030)
pH: 5 (ref 5.0–8.0)

## 2024-11-08 LAB — CBC WITH DIFFERENTIAL/PLATELET
Abs Immature Granulocytes: 0.05 K/uL (ref 0.00–0.07)
Basophils Absolute: 0 K/uL (ref 0.0–0.1)
Basophils Relative: 0 %
Eosinophils Absolute: 0.1 K/uL (ref 0.0–0.5)
Eosinophils Relative: 1 %
HCT: 43.9 % (ref 36.0–46.0)
Hemoglobin: 14.5 g/dL (ref 12.0–15.0)
Immature Granulocytes: 1 %
Lymphocytes Relative: 20 %
Lymphs Abs: 1.9 K/uL (ref 0.7–4.0)
MCH: 29.9 pg (ref 26.0–34.0)
MCHC: 33 g/dL (ref 30.0–36.0)
MCV: 90.5 fL (ref 80.0–100.0)
Monocytes Absolute: 0.6 K/uL (ref 0.1–1.0)
Monocytes Relative: 6 %
Neutro Abs: 6.8 K/uL (ref 1.7–7.7)
Neutrophils Relative %: 72 %
Platelets: 355 K/uL (ref 150–400)
RBC: 4.85 MIL/uL (ref 3.87–5.11)
RDW: 12.8 % (ref 11.5–15.5)
WBC: 9.4 K/uL (ref 4.0–10.5)
nRBC: 0 % (ref 0.0–0.2)

## 2024-11-08 LAB — HCG, SERUM, QUALITATIVE: Preg, Serum: NEGATIVE

## 2024-11-08 LAB — COMPREHENSIVE METABOLIC PANEL WITH GFR
ALT: 25 U/L (ref 0–44)
AST: 18 U/L (ref 15–41)
Albumin: 4.3 g/dL (ref 3.5–5.0)
Alkaline Phosphatase: 157 U/L — ABNORMAL HIGH (ref 38–126)
Anion gap: 10 (ref 5–15)
BUN: 16 mg/dL (ref 6–20)
CO2: 24 mmol/L (ref 22–32)
Calcium: 9.7 mg/dL (ref 8.9–10.3)
Chloride: 106 mmol/L (ref 98–111)
Creatinine, Ser: 0.71 mg/dL (ref 0.44–1.00)
GFR, Estimated: >60 mL/min
Glucose, Bld: 101 mg/dL — ABNORMAL HIGH (ref 70–99)
Potassium: 4.2 mmol/L (ref 3.5–5.1)
Sodium: 140 mmol/L (ref 135–145)
Total Bilirubin: 0.2 mg/dL (ref 0.0–1.2)
Total Protein: 7.5 g/dL (ref 6.5–8.1)

## 2024-11-08 LAB — LIPASE, BLOOD: Lipase: 49 U/L (ref 11–51)

## 2024-11-08 MED ORDER — KETOROLAC TROMETHAMINE 15 MG/ML IJ SOLN
15.0000 mg | Freq: Once | INTRAMUSCULAR | Status: AC
  Administered 2024-11-08: 15 mg via INTRAVENOUS
  Filled 2024-11-08: qty 1

## 2024-11-08 MED ORDER — OXYCODONE-ACETAMINOPHEN 5-325 MG PO TABS: 1.0000 | ORAL_TABLET | Freq: Four times a day (QID) | ORAL | 0 refills | Status: AC | PRN

## 2024-11-08 NOTE — ED Provider Notes (Signed)
 " [place] EMERGENCY DEPARTMENT AT [facility] Provider Note   CSN: [id number] Arrival date & time: 11/08/24  9354     Patient presents with: Flank Pain   Christina Case is a 28 y.o. female.    Flank Pain  28 year old female presenting with flank pain.  Patient states that she was awoken this morning at about 2 AM with pain.  She reports that the pain is in her right flank and radiates into her groin.  She reports that she has had a kidney stone before and it felt like this.  She endorses a decrease in urination and it was a dark color she reports.  She reports a little bit of pain with urination.  She denies any abdominal pain diarrhea or constipation.  She does however state that she threw up once and is still feeling nauseous.  Patient denies any chest pain or shortness of breath.  Reports good bowel movement     Prior to Admission medications  Medication Sig Start Date End Date Taking? Authorizing Provider  oxyCODONE -acetaminophen  (PERCOCET/ROXICET) 5-325 MG tablet Take 1 tablet by mouth every 6 (six) hours as needed for severe pain (pain score 7-10). 11/08/24  Yes Rosaline Almarie MATSU, PA-C  SUMAtriptan (IMITREX) 25 MG tablet Take 25 mg by mouth every 2 (two) hours as needed for migraine. May repeat in 2 hours if headache persists or recurs.    [provider]    Allergies: Doxycycline, Sulfa  antibiotics, Hydrocodone , and Zofran [ondansetron hcl]    Review of Systems  Genitourinary:  Positive for flank pain.  All other systems reviewed and are negative.   Updated Vital Signs BP (!) 122/53 (BP Location: Left Arm)   Pulse 79   Temp 98 F (36.7 C) (Oral)   Resp 18   SpO2 99%   Physical Exam Vitals and nursing note reviewed.  HENT:     Mouth/Throat:     Pharynx: Oropharynx is clear.  Cardiovascular:     Rate and Rhythm: Normal rate.     Pulses: Normal pulses.  Pulmonary:     Effort: Pulmonary effort is normal.     Breath sounds: Normal breath  sounds.  Abdominal:     General: Abdomen is flat. Bowel sounds are normal.     Palpations: Abdomen is soft.     Tenderness: There is right CVA tenderness. There is no left CVA tenderness, guarding or rebound. Negative signs include Murphy's sign, Rovsing's sign and McBurney's sign.     Comments: Pain to palpation right.  When palpating the right pain radiates to the groin.  Skin:    General: Skin is warm and dry.  Neurological:     General: No focal deficit present.     Mental Status: She is alert.     (all labs ordered are listed, but only abnormal results are displayed) Labs Reviewed  COMPREHENSIVE METABOLIC PANEL WITH GFR - Abnormal; Notable for the following components:      Result Value   Glucose, Bld 101 (*)    Alkaline Phosphatase 157 (*)    All other components within normal limits  URINALYSIS, ROUTINE W REFLEX MICROSCOPIC - Abnormal; Notable for the following components:   APPearance HAZY (*)    Hgb urine dipstick LARGE (*)    Protein, ur 30 (*)    Bacteria, UA RARE (*)    All other components within normal limits  CBC WITH DIFFERENTIAL/PLATELET  LIPASE, BLOOD  HCG, SERUM, QUALITATIVE    EKG: None  Radiology: CT Renal Stone Study Result Date: 11/08/2024 CLINICAL DATA:  Acute right flank pain from several hours.  Nausea. EXAM: CT ABDOMEN AND PELVIS WITHOUT CONTRAST TECHNIQUE: Multidetector CT imaging of the abdomen and pelvis was performed following the standard protocol without IV contrast. RADIATION DOSE REDUCTION: This exam was performed according to the departmental dose-optimization program which includes automated exposure control, adjustment of the mA and/or kV according to patient size and/or use of iterative reconstruction technique. COMPARISON:  05/04/2020 FINDINGS: Lower chest: No acute findings. Hepatobiliary: No mass visualized on this unenhanced exam. Gallbladder is unremarkable. No evidence of biliary ductal dilatation. Pancreas: No mass or inflammatory  process visualized on this unenhanced exam. Spleen:  Within normal limits in size. Adrenals/Urinary tract: Punctate nonobstructing left renal calculus noted. New mild right renal hydroureteronephrosis is seen due to a 4 mm distal ureteral calculus at the right UVJ. Unremarkable unopacified urinary bladder. Stomach/Bowel: No evidence of obstruction, inflammatory process, or abnormal fluid collections. Vascular/Lymphatic: No pathologically enlarged lymph nodes identified. No evidence of abdominal aortic aneurysm. Reproductive: IUD seen in appropriate position. No mass or other significant abnormality. Other:  None. Musculoskeletal:  No suspicious bone lesions identified. IMPRESSION: Mild right hydroureteronephrosis due to 4 mm distal ureteral calculus at the right UVJ. Punctate nonobstructing left renal calculus. Electronically Signed   By: Norleen DELENA Kil M.D.   On: 11/08/2024 09:52     Procedures   Medications Ordered in the ED  ketorolac  (TORADOL ) 15 MG/ML injection 15 mg (15 mg Intravenous Given 11/08/24 0941)                                    Medical Decision Making Amount and/or Complexity of Data Reviewed Labs: ordered. Radiology: ordered.  Risk Prescription drug management.   Impression: 28 year old female presenting with flank pain.  Reports of backinclude kidney stone, pyelonephritis, hydronephrosis, UTI, muscle strain patient was able to provide history.  I also reviewed other outpatient notes.  Additional History: patient was able to provide history.  I also reviewed other outpatient notes.  Labs: CBC showed no signs of infection or anemia.  CMP shows an elevated alk phos however no elevation in AST or ALT and bilirubin is within normal limits.  Urinalysis shows some blood and protein likely due to kidney stone.  No signs of bacteria or white blood cell.  Imaging: I personally interpreted CT.  CT renal stone study showed right hydroureteral nephrosis due to 4 mm distal ureteral  stone  ED Course/Meds: Patient remained stable while in the ER.  CT found a 4 mm kidney stone.  Patient was given Toradol  for the pain.  And she reports that her pain has subsided somewhat.  Patient reports that she has frequent kidney stones however has not seen urologist yet.  I have referred her to urology for follow-up.  I also educated her to use a strainer to catch the stone and take to the appointment.  Patient was prescribed Percocet to help with pain.  Patient was educated that this is a narcotic and the side effects that may occur with this.  Patient was educated to follow-up with neurology and if symptoms worsen she can return to the ER.     Final diagnoses:  Kidney stone    ED Discharge Orders          Ordered    oxyCODONE -acetaminophen  (PERCOCET/ROXICET) 5-325 MG tablet  Every 6 hours PRN  11/08/24 1016               Rosaline Almarie MATSU, [place] 11/08/24 1035    Ula Prentice SAUNDERS, MD 11/08/24 1422  "

## 2024-11-08 NOTE — Discharge Instructions (Addendum)
You had a 4mm kidney stone located in your ureter. Strain all urine with a strainer that was provided to you.  Make sure you drink plenty of water to maintain hydration.  Please use Tylenol or ibuprofen for pain.  You may use 600 mg ibuprofen every 6 hours or 1000 mg of Tylenol every 6 hours.  You may choose to alternate between the 2.  This would be most effective.  Not to exceed 4 g of Tylenol within 24 hours.  Not to exceed 3200 mg ibuprofen 24 hours.     I have prescribed or offered you other medicine for breakthrough pain. Notably there is a small amount of Tylenol--specifically 325 mg--in each dose of Percocet.  If you do take a dose of Percocet please take a 500 mg instead of the 1000 mg dose of Tylenol.     Follow up with [facility] I have given you the information for their office.  Generally kidney stones pass on their own given time in the focus of treatment is minimizing pain   

## 2024-11-08 NOTE — ED Triage Notes (Signed)
 Patient c/o right flank pain x 4 hours. Patient report she took ibuprofen  for pain with relief then woke up this morning with 8/10 sharp pain. Patient report nausea and vomiting x 1 this morning. Patient denies fever. Hx Kidney stones

## 2024-11-08 NOTE — ED Notes (Signed)
Pt provided with urine strainer
